# Patient Record
Sex: Male | Born: 1990 | Race: White | Hispanic: No | Marital: Married | Smoking: Never smoker
Health system: Southern US, Community
[De-identification: ages and names within clinical notes are randomized; demographics above are authoritative.]

---
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
## 2011-04-19 ENCOUNTER — Ambulatory Visit: Payer: Self-pay | Admitting: Unknown Physician Specialty

## 2011-05-14 ENCOUNTER — Ambulatory Visit: Payer: Self-pay | Admitting: Unknown Physician Specialty

## 2011-06-13 ENCOUNTER — Ambulatory Visit: Payer: Self-pay | Admitting: Unknown Physician Specialty

## 2012-03-10 ENCOUNTER — Ambulatory Visit: Payer: Self-pay | Admitting: Family Medicine

## 2012-05-26 ENCOUNTER — Emergency Department: Payer: Self-pay | Admitting: *Deleted

## 2012-06-27 ENCOUNTER — Emergency Department: Payer: Self-pay

## 2012-11-17 ENCOUNTER — Other Ambulatory Visit: Payer: Self-pay | Admitting: Psychiatry

## 2012-11-17 LAB — CBC WITH DIFFERENTIAL/PLATELET
Basophil %: 0.7 %
HGB: 16 g/dL (ref 13.0–18.0)
Lymphocyte #: 3 10*3/uL (ref 1.0–3.6)
MCH: 31.7 pg (ref 26.0–34.0)
MCHC: 33.9 g/dL (ref 32.0–36.0)
MCV: 94 fL (ref 80–100)
Neutrophil #: 2.5 10*3/uL (ref 1.4–6.5)
Neutrophil %: 41 %
RBC: 5.05 10*6/uL (ref 4.40–5.90)
RDW: 12.9 % (ref 11.5–14.5)
WBC: 6 10*3/uL (ref 3.8–10.6)

## 2012-11-17 LAB — COMPREHENSIVE METABOLIC PANEL
Alkaline Phosphatase: 81 U/L (ref 50–136)
Calcium, Total: 8.8 mg/dL (ref 8.5–10.1)
Chloride: 106 mmol/L (ref 98–107)
Co2: 31 mmol/L (ref 21–32)
Creatinine: 1.03 mg/dL (ref 0.60–1.30)
EGFR (Non-African Amer.): >60
Glucose: 120 mg/dL — ABNORMAL HIGH (ref 65–99)
Potassium: 4.1 mmol/L (ref 3.5–5.1)
SGOT(AST): 22 U/L (ref 15–37)
SGPT (ALT): 27 U/L (ref 12–78)
Total Protein: 7.8 g/dL (ref 6.4–8.2)

## 2012-11-17 LAB — VALPROIC ACID LEVEL: Valproic Acid: 32 ug/mL — ABNORMAL LOW

## 2012-11-20 ENCOUNTER — Ambulatory Visit: Payer: Self-pay | Admitting: Psychiatry

## 2012-12-11 ENCOUNTER — Ambulatory Visit: Payer: Self-pay | Admitting: Psychiatry

## 2012-12-25 LAB — DRUG SCREEN, URINE
Barbiturates, Ur Screen: NEGATIVE (ref ?–200)
Benzodiazepine, Ur Scrn: POSITIVE (ref ?–200)
Cocaine Metabolite,Ur ~~LOC~~: NEGATIVE (ref ?–300)
MDMA (Ecstasy)Ur Screen: NEGATIVE (ref ?–500)
Methadone, Ur Screen: NEGATIVE (ref ?–300)
Phencyclidine (PCP) Ur S: NEGATIVE (ref ?–25)

## 2013-01-10 ENCOUNTER — Ambulatory Visit: Payer: Self-pay | Admitting: Psychiatry

## 2013-04-24 LAB — DRUG SCREEN, URINE
Amphetamines, Ur Screen: NEGATIVE (ref ?–1000)
Barbiturates, Ur Screen: NEGATIVE (ref ?–200)
Benzodiazepine, Ur Scrn: NEGATIVE (ref ?–200)
Cannabinoid 50 Ng, Ur ~~LOC~~: POSITIVE (ref ?–50)
Methadone, Ur Screen: NEGATIVE (ref ?–300)
Opiate, Ur Screen: NEGATIVE (ref ?–300)
Phencyclidine (PCP) Ur S: NEGATIVE (ref ?–25)

## 2013-04-24 LAB — URINALYSIS, COMPLETE
Bilirubin,UR: NEGATIVE
Blood: NEGATIVE
Glucose,UR: NEGATIVE mg/dL (ref 0–75)
Ketone: NEGATIVE
Leukocyte Esterase: NEGATIVE
Protein: NEGATIVE
RBC,UR: <1 /HPF (ref 0–5)
Squamous Epithelial: NONE SEEN
WBC UR: NONE SEEN /HPF (ref 0–5)

## 2013-04-24 LAB — COMPREHENSIVE METABOLIC PANEL
Anion Gap: 4 — ABNORMAL LOW (ref 7–16)
Bilirubin,Total: 0.5 mg/dL (ref 0.2–1.0)
Calcium, Total: 9.6 mg/dL (ref 8.5–10.1)
Chloride: 104 mmol/L (ref 98–107)
EGFR (Non-African Amer.): >60
Glucose: 101 mg/dL — ABNORMAL HIGH (ref 65–99)
Osmolality: 281 (ref 275–301)
SGPT (ALT): 20 U/L (ref 12–78)
Sodium: 140 mmol/L (ref 136–145)
Total Protein: 8.1 g/dL (ref 6.4–8.2)

## 2013-04-24 LAB — TSH: Thyroid Stimulating Horm: 2.06 u[IU]/mL

## 2013-04-24 LAB — CBC
HGB: 15.4 g/dL (ref 13.0–18.0)
Platelet: 224 10*3/uL (ref 150–440)
RBC: 4.76 10*6/uL (ref 4.40–5.90)

## 2013-04-25 ENCOUNTER — Inpatient Hospital Stay: Payer: Self-pay | Admitting: Psychiatry

## 2014-11-22 ENCOUNTER — Emergency Department: Payer: Self-pay | Admitting: Emergency Medicine

## 2015-01-02 NOTE — H&P (Signed)
PATIENT NAME:  Shawn Phelps, Shawn Phelps MR#:  [id number] DATE OF BIRTH:  April 22, 1991  REFERRING PHYSICIAN: Emergency Room M.D.   ATTENDING PHYSICIAN: Kristine Linea, M.D.   IDENTIFYING DATA: Shawn Phelps is a 24 year old male with history of depression, anxiety, mood instability, and substance abuse.   CHIEF COMPLAINT: "I am here for help.".  HISTORY OF PRESENT ILLNESS: Shawn Phelps came to the hospital following a suicide attempt or gesture by cutting his wrist and throat superficially. He reports that on the day of admission, his wife called him at work to tell him that she is leaving him and is on her way to court to file for a divorce. At that time the patient was two hours away working. He return to home and did not find his wife or his 68-month-old baby at home and believed that she already left him. He scratched his wrists superficially several times and reportedly scratched his neck as well which the patient now denies.   Luckily his wife returned home and found him bleeding. The patient was brought initially to the office of Shawn Phelps who took the patient to the Emergency Room where he was committed by the Emergency Room physician. The patient denies symptoms of depression and feels that Cymbalta prescribed by Shawn Phelps has been working very well for him. He also receives clonazepam from Shawn Phelps. He reports good compliance with medication and adamantly denies any Klonopin misuse.   He endorses severe anxiety with frequent panic attacks and Klonopin has been helpful with that. He reports that he is allowed to take two 1 mg Klonopins a day which he usually takes in the afternoon and at night because it calms him down and makes interaction with his wife a little easier.   The patient is not forthcoming about his marital problems him and cannot explain why his wife threatened to divorce him. He denies psychotic symptoms. He denies symptoms suggestive of bipolar mania but endorse frequent mood swings  as if he had a mean alter ego who he is unable to control. He now believes that a diagnosis of bipolar disorder is a good possibility after and after discussing the case with Shawn Phelps, decided to try Seroquel. Indeed, he feels very hopeful and positive about the Seroquel as apparently he tried to take it once, obtaining it from a friend. He felt that it was calming him down. He denies ever buying Seroquel off the streets.   He denies drinking or illicit substance use but believes that marijuana was detected on urine tox screen. He states that he used it in a long time ago, but he knows that it stays in the system for 50 days.   PAST PSYCHIATRIC HISTORY: The patient had been a patient of Shawn Phelps for about two years. He believes that it was when he was still a teenager. They tried multiple antidepressants, SSRIs, and none of them worked. The patient was also tried on Depakote, but judging from his Depakote level, he has not been compliant.   He has a history of substance abuse which he minimizes or even denies and was in the outpatient intensive substance abuse treatment program at least twice at [facility] in the fall of 2012 and in the spring of 2014. This was always due to legal charges. The patient never felt that he had a substance problem. He had some legal charges that were substance-related and not a DUI but really does not want to discuss it with me.  Currently, he is on probation unsupervised and this was for marijuana possession.   FAMILY PSYCHIATRIC HISTORY: His sister as well as some other family members are taking antidepressants. There were no completed suicides in the family.   PAST MEDICAL HISTORY: None.   ALLERGIES: No known drug allergies.   MEDICATIONS ON ADMISSION: Cymbalta 60 mg, Klonopin 1 mg twice daily. The patient has not been compliant with Cymbalta as he run out.   SOCIAL HISTORY: He graduated from high school and had some college and community  college classes, does very well in school and made the dean's list on several occasions. He is married now and has a 64-month-old baby. He works in [employer]. His mother and father have been very supportive.   REVIEW OF SYSTEMS:  CONSTITUTIONAL: No fevers or chills. No weight changes.  EYES: No double or blurred vision.  ENT: No hearing loss.  RESPIRATORY: No shortness of breath or cough.  CARDIOVASCULAR: No chest pain or orthopnea.  GASTROINTESTINAL: No abdominal pain, nausea, vomiting, or diarrhea.  GENITOURINARY: No incontinence or frequency.  ENDOCRINE: No heat or cold intolerance.  LYMPHATIC: No anemia or easy bruising.  INTEGUMENTARY: No acne or rash.  MUSCULOSKELETAL: No muscle or joint pain.  NEUROLOGIC: No tingling or weakness.  PSYCHIATRIC: See history of present illness for details.   PHYSICAL EXAMINATION:  VITAL SIGNS: Blood pressure 113/68, pulse 72, respirations 20, temperature 98.  GENERAL: A well-developed young male in no acute distress.  HEENT: The pupils are equal, round, and reactive to light. Sclerae are anicteric.  NECK: Supple. No thyromegaly.  LUNGS: Clear to auscultation. No dullness to percussion.  HEART: Regular rhythm and rate. No murmurs, rubs, or gallops.  ABDOMEN: Soft, nontender, nondistended. Positive bowel sounds.  MUSCULOSKELETAL: Normal muscle strength in all extremities.  SKIN: No rashes or bruises.  LYMPHATIC: No cervical adenopathy.  NEUROLOGIC: Cranial nerves II through XII are intact.   LABORATORY DATA: Chemistries are within normal limits. Blood alcohol level is zero. LFTs within normal limits. TSH 2.06. Urine tox screen positive for cannabinoids. CBC within normal limits. Urinalysis is not suggestive of urinary tract infection.   MENTAL STATUS EXAMINATION ON ADMISSION: The patient is in bed, asleep, but easily arousable. He is pleasant, polite and cooperative. Slightly irritable on certain questions. He is well groomed and casually  dressed. He maintains good eye contact. His speech is of normal rhythm, rate and volume. Mood is anxious with normal affect. Thought processing is logical and goal oriented. Thought content: He denies suicidal or homicidal ideation but was admitted after a suicide attempt by cutting. There are no delusions or paranoia. There are no auditory or visual hallucinations. His cognition is grossly intact. His insight and judgment are questionable.   SUICIDE RISK ASSESSMENT ON ADMISSION: This is a patient with a long history of depression, anxiety, mood instability and substance abuse who came to the hospital after cutting his wrist in the context of  marital problems.  INITIAL DIAGNOSES:  AXIS I: Mood disorder, not otherwise specified. Rule out bipolar affective disorder. Marijuana abuse.  AXIS II: Deferred.  AXIS III: Cuts on left forearm.  AXIS IV: Mental illness, substance abuse, treatment compliance, family conflict, marital conflict.  AXIS V: Global assessment of functioning score on admission 25.   PLAN: The patient was admitted to [facility] Medicine unit for safety, stabilization and medication management. He was initially placed on suicide precautions and will be closely monitored for any unsafe behaviors. He underwent  full psychiatric and risk assessment. He received pharmacotherapy, individual and group psychotherapy, substance abuse counseling, and support from therapeutic milieu.   1.  Suicidal ideation. This has resolved. The patient is able to contract for safety.  2.  Mood. He wants to continue Cymbalta. We will continue 60 mg a day.  3.  Rule out bipolar affective disorder. The patient endorses frequent mood swings with hyperactivity, irritability, insomnia, short temper, poor impulse control. He discussed with Dr. Maryruth BunKapur treatment with Seroquel and wants to try it. We will start Seroquel at night, maybe Sequel XR 150 mg. We will also offer a low-dose Seroquel  throughout the day to address excessive anxiety.  4.  Benzodiazepine abuse. it is possible that the patient is abusing benzodiazepines. They are prescribed by Shawn Phelps. The patient was negative for benzodiazepines on admission, but it is not unusual with clonazepam. We will monitor for any symptoms of benzodiazepine withdrawals. We will offer detoxification if necessary.  5.  Anxiety. We will offer Vistaril. 6.  Substance abuse. The patient minimizes or frankly denies any problems with substances in spite of evidence to the contrary. Not only has he been in the intensive outpatient twice but was also referred to inpatient substance abuse treatment program. Reportedly he minimized his abuse at that time and was not accepted. The patient is not interested in treatment. He feels that he just completed an intensive outpatient and should be okay. This is good enough for court purpose.   DISPOSITION: He will be discharged to home with his wife unless he decides to go for substance abuse treatment. He will follow up with a psychiatrist. He believes that Shawn Phelps is the best doctor he has ever had.    ____________________________ Shawn GoodieJolanta B. Jennet MaduroPucilowska, Shawn Phelps jbp:np D: 04/25/2013 20:03:09 ET T: 04/25/2013 21:59:59 ET JOB#: [id number]  cc: Shawn B. Jennet MaduroPucilowska, Shawn Phelps, <Dictator> Shawn Phelps ELECTRONICALLY SIGNED 04/27/2013 15:12

## 2015-01-02 NOTE — Consult Note (Signed)
Consult: treatment recommendations Patient was seen as requested by attending MD to explore option of [facility] CD-IOP at this discharge and was seen as a possible prime candidate for treatment at this level of care after this inpatient treatment has been completed however he believes that because he has been in the CD-IOP in the past that he does not need treatment at the CD-IOP level. He was able to report that he was depressed and that he cut his wrist because of conflict between his wife and him. He also disclosed issue of conflict between him and his parents as it related to their perception about his substance abuse. Later his wife entered session and reviewed events that lead to this inpatient admission and an issue of breach of confidentiality among her co workers was also discussed.  Shawn Phelps denied suicidality even though he did admit to cutting his wrist and throat superficially. He reported that on the day of admission, his wife and he were experiencing distress in their marriage and thru a series of event which included cutting his wrist several times and also that he had scratched his neck. He also reported that he was brought initially to the office of Dr. Maryruth Phelps who took him to the Emergency Room where he was subsequently [facility].     Patient expressed desire for treatment for mental health issues and denied having a substance abuse problem. He disagreed with this therapist?s recommendation of [facility] after Discharge from the Behavioral Medicine Inpatient Unit. He admitted to abuse of Alcoholic Beverage as well as marijuana and his Rx med with a desire to not use/abuse substances but does not believe that he could benefit from CD-IOP. He was also focused on his return to home, place of employment and expressed desire to be discharged as early as possible. trem Outpatient counseling focused on co occurring disorders may be beneficial post this  inpatient treatment. Assigned Nurse informed of assessment recommendations which includes his plans to not follow CD-IOP at this inpatient discharge.   Electronic Signatures: Huel Phelps, Shawn (PsyD)  (Signed on 15-Aug-14 21:36)  Authored  Last Updated: 15-Aug-14 21:36 by Huel Phelps, Shawn (PsyD)

## 2016-01-12 ENCOUNTER — Encounter: Payer: Self-pay | Admitting: Emergency Medicine

## 2016-01-12 ENCOUNTER — Emergency Department
Admission: EM | Admit: 2016-01-12 | Discharge: 2016-01-12 | Disposition: A | Discharge status: Home / Self Care | Payer: 59 | Attending: Emergency Medicine | Admitting: Emergency Medicine

## 2016-01-12 DIAGNOSIS — W260XXA Contact with knife, initial encounter: Secondary | ICD-10-CM | POA: Diagnosis not present

## 2016-01-12 DIAGNOSIS — Y9389 Activity, other specified: Secondary | ICD-10-CM | POA: Insufficient documentation

## 2016-01-12 DIAGNOSIS — S61012A Laceration without foreign body of left thumb without damage to nail, initial encounter: Secondary | ICD-10-CM | POA: Diagnosis not present

## 2016-01-12 DIAGNOSIS — Y929 Unspecified place or not applicable: Secondary | ICD-10-CM | POA: Diagnosis not present

## 2016-01-12 DIAGNOSIS — Y999 Unspecified external cause status: Secondary | ICD-10-CM | POA: Insufficient documentation

## 2016-01-12 MED ORDER — LIDOCAINE HCL (PF) 1 % IJ SOLN
INTRAMUSCULAR | Status: AC
  Filled 2016-01-12: qty 10

## 2016-01-12 NOTE — ED Notes (Signed)
Pt with laceration to left thumb last night.

## 2016-01-12 NOTE — ED Provider Notes (Signed)
[facility] Emergency Department Provider Note  ____________________________________________  Time seen: Approximately 9:03 AM  I have reviewed the triage vital signs and the nursing notes.   HISTORY  Chief Complaint Laceration    HPI Shawn Phelps is a 25 y.o. male presents for evaluation of left thumb laceration. Patient reports slicing on knife approximately 10 hours prior to arrival. Last tetanus approximately one year ago.   History reviewed. No pertinent past medical history.  There are no active problems to display for this patient.   History reviewed. No pertinent past surgical history.  No current outpatient prescriptions on file.  Allergies Review of patient's allergies indicates no known allergies.  No family history on file.  Social History Social History  Substance Use Topics  . Smoking status: Never Smoker   . Smokeless tobacco: None  . Alcohol Use: No    Review of Systems Constitutional: No fever/chills Eyes: No visual changes. ENT: No sore throat. Cardiovascular: Denies chest pain. Respiratory: Denies shortness of breath. Gastrointestinal: No abdominal pain.  No nausea, no vomiting.  No diarrhea.  No constipation. Genitourinary: Negative for dysuria. Musculoskeletal: Negative for back pain. Skin: Positive for laceration at the base of the left thumb which is horizontal in nature approximately 3 cm. Neurological: Negative for headaches, focal weakness or numbness.  10-point ROS otherwise negative.  ____________________________________________   PHYSICAL EXAM:  VITAL SIGNS: ED Triage Vitals  Enc Vitals Group     BP 01/12/16 0834 125/77 mmHg     Pulse Rate 01/12/16 0834 71     Resp 01/12/16 0834 20     Temp 01/12/16 0834 97.7 F (36.5 C)     Temp Source 01/12/16 0834 Oral     SpO2 01/12/16 0834 100 %     Weight 01/12/16 0834 165 lb (74.844 kg)     Height 01/12/16 0834 6' (1.829 m)     Head Cir --      Peak  Flow --      Pain Score 01/12/16 0827 7     Pain Loc --      Pain Edu? --      Excl. in GC? --     Constitutional: Alert and oriented. Well appearing and in no acute distress. Musculoskeletal: No lower extremity tenderness nor edema.  No joint effusions. Neurologic:  Normal speech and language. No gross focal neurologic deficits are appreciated. No gait instability. Skin:  Palmar aspect left thumb laceration at the base of the thumb approximately 3 cm horizontal. No tendon or ligament involvement of bone noted. Distally neurovascularly intact. Range of motion. Good strength. Psychiatric: Mood and affect are normal. Speech and behavior are normal.  ____________________________________________   LABS (all labs ordered are listed, but only abnormal results are displayed)  Labs Reviewed - No data to display ____________________________________________    PROCEDURES  Procedure(s) performed: Yes  LACERATION REPAIR Performed by: Evangeline Dakin Authorized by: Evangeline Dakin Consent: Verbal consent obtained. Risks and benefits: risks, benefits and alternatives were discussed Consent given by: patient Patient identity confirmed: provided demographic data Prepped and Draped in normal sterile fashion Wound explored  Laceration Location: Base of left thumb  Laceration Length: 2cm  No Foreign Bodies seen or palpated  Anesthesia: local infiltration  Local anesthetic: lidocaine 1% without epinephrine  Anesthetic total: 3 ml  Irrigation method: syringe Amount of cleaning: standard  Skin closure: 4-0 vicryl,   Number of sutures: 5  Technique: simple interrupted  Patient tolerance: Patient tolerated the  procedure well with no immediate complications. Critical Care performed: No  ____________________________________________   INITIAL IMPRESSION / ASSESSMENT AND PLAN / ED COURSE  Pertinent labs & imaging results that were available during my care of the patient were  reviewed by me and considered in my medical decision making (see chart for details).  Laceration to left thumb area noted as detailed above. Patient had sutures removed in 7-10 days. Encouraged her to urgent care for removal. He denies any other complaints at this time. ____________________________________________   FINAL CLINICAL IMPRESSION(S) / ED DIAGNOSES  Final diagnoses:  Thumb laceration, left, initial encounter     This chart was dictated using voice recognition software/Dragon. Despite best efforts to proofread, errors can occur which can change the meaning. Any change was purely unintentional.   Evangeline Dakinharles M Mahalie Kanner, PA-C 01/12/16 [id number]  Jeanmarie PlantJames A McShane, MD 01/12/16 [phone]

## 2016-01-12 NOTE — Discharge Instructions (Signed)
Laceration Care, Adult  A laceration is a cut that goes through all layers of the skin. The cut also goes into the tissue that is right under the skin. Some cuts heal on their own. Others need to be closed with stitches (sutures), staples, skin adhesive strips, or wound glue. Taking care of your cut lowers your risk of infection and helps your cut to heal better.  HOW TO TAKE CARE OF YOUR CUT  For stitches or staples:  · Keep the wound clean and dry.  · If you were given a bandage (dressing), you should change it at least one time per day or as told by your doctor. You should also change it if it gets wet or dirty.  · Keep the wound completely dry for the first 24 hours or as told by your doctor. After that time, you may take a shower or a bath. However, make sure that the wound is not soaked in water until after the stitches or staples have been removed.  · Clean the wound one time each day or as told by your doctor:    Wash the wound with soap and water.    Rinse the wound with water until all of the soap comes off.    Pat the wound dry with a clean towel. Do not rub the wound.  · After you clean the wound, put a thin layer of antibiotic ointment on it as told by your doctor. This ointment:    Helps to prevent infection.    Keeps the bandage from sticking to the wound.  · Have your stitches or staples removed as told by your doctor.  If your doctor used skin adhesive strips:   · Keep the wound clean and dry.  · If you were given a bandage, you should change it at least one time per day or as told by your doctor. You should also change it if it gets dirty or wet.  · Do not get the skin adhesive strips wet. You can take a shower or a bath, but be careful to keep the wound dry.  · If the wound gets wet, pat it dry with a clean towel. Do not rub the wound.  · Skin adhesive strips fall off on their own. You can trim the strips as the wound heals. Do not remove any strips that are still stuck to the wound. They will  fall off after a while.  If your doctor used wound glue:  · Try to keep your wound dry, but you may briefly wet it in the shower or bath. Do not soak the wound in water, such as by swimming.  · After you take a shower or a bath, gently pat the wound dry with a clean towel. Do not rub the wound.  · Do not do any activities that will make you really sweaty until the skin glue has fallen off on its own.  · Do not apply liquid, cream, or ointment medicine to your wound while the skin glue is still on.  · If you were given a bandage, you should change it at least one time per day or as told by your doctor. You should also change it if it gets dirty or wet.  · If a bandage is placed over the wound, do not let the tape for the bandage touch the skin glue.  · Do not pick at the glue. The skin glue usually stays on for 5-10 days. Then, it   falls off of the skin.  General Instructions   · To help prevent scarring, make sure to cover your wound with sunscreen whenever you are outside after stitches are removed, after adhesive strips are removed, or when wound glue stays in place and the wound is healed. Make sure to wear a sunscreen of at least 30 SPF.  · Take over-the-counter and prescription medicines only as told by your doctor.  · If you were given antibiotic medicine or ointment, take or apply it as told by your doctor. Do not stop using the antibiotic even if your wound is getting better.  · Do not scratch or pick at the wound.  · Keep all follow-up visits as told by your doctor. This is important.  · Check your wound every day for signs of infection. Watch for:    Redness, swelling, or pain.    Fluid, blood, or pus.  · Raise (elevate) the injured area above the level of your heart while you are sitting or lying down, if possible.  GET HELP IF:  · You got a tetanus shot and you have any of these problems at the injection site:    Swelling.    Very bad pain.    Redness.    Bleeding.  · You have a fever.  · A wound that was  closed breaks open.  · You notice a bad smell coming from your wound or your bandage.  · You notice something coming out of the wound, such as wood or glass.  · Medicine does not help your pain.  · You have more redness, swelling, or pain at the site of your wound.  · You have fluid, blood, or pus coming from your wound.  · You notice a change in the color of your skin near your wound.  · You need to change the bandage often because fluid, blood, or pus is coming from the wound.  · You start to have a new rash.  · You start to have numbness around the wound.  GET HELP RIGHT AWAY IF:  · You have very bad swelling around the wound.  · Your pain suddenly gets worse and is very bad.  · You notice painful lumps near the wound or on skin that is anywhere on your body.  · You have a red streak going away from your wound.  · The wound is on your hand or foot and you cannot move a finger or toe like you usually can.  · The wound is on your hand or foot and you notice that your fingers or toes look pale or bluish.     This information is not intended to replace advice given to you by your health care provider. Make sure you discuss any questions you have with your health care provider.     Document Released: 02/15/2008 Document Revised: 01/13/2015 Document Reviewed: 08/25/2014  Elsevier Interactive Patient Education ©2016 [employer]

## 2016-01-12 NOTE — ED Notes (Signed)
States he was using a "cheap" knife to open a package and it slipped   Laceration to left thumb area

## 2016-03-22 DIAGNOSIS — G894 Chronic pain syndrome: Secondary | ICD-10-CM | POA: Diagnosis not present

## 2017-06-07 ENCOUNTER — Emergency Department: Payer: 59

## 2017-06-07 ENCOUNTER — Emergency Department
Admission: EM | Admit: 2017-06-07 | Discharge: 2017-06-07 | Disposition: A | Discharge status: Home / Self Care | Payer: 59 | Attending: Emergency Medicine | Admitting: Emergency Medicine

## 2017-06-07 DIAGNOSIS — R451 Restlessness and agitation: Secondary | ICD-10-CM | POA: Diagnosis not present

## 2017-06-07 DIAGNOSIS — Y929 Unspecified place or not applicable: Secondary | ICD-10-CM | POA: Insufficient documentation

## 2017-06-07 DIAGNOSIS — F1994 Other psychoactive substance use, unspecified with psychoactive substance-induced mood disorder: Secondary | ICD-10-CM

## 2017-06-07 DIAGNOSIS — Y999 Unspecified external cause status: Secondary | ICD-10-CM | POA: Diagnosis not present

## 2017-06-07 DIAGNOSIS — Y9389 Activity, other specified: Secondary | ICD-10-CM | POA: Insufficient documentation

## 2017-06-07 DIAGNOSIS — W1830XA Fall on same level, unspecified, initial encounter: Secondary | ICD-10-CM | POA: Diagnosis not present

## 2017-06-07 DIAGNOSIS — R456 Violent behavior: Secondary | ICD-10-CM | POA: Diagnosis not present

## 2017-06-07 DIAGNOSIS — S0081XA Abrasion of other part of head, initial encounter: Secondary | ICD-10-CM | POA: Diagnosis not present

## 2017-06-07 DIAGNOSIS — S0101XA Laceration without foreign body of scalp, initial encounter: Secondary | ICD-10-CM

## 2017-06-07 DIAGNOSIS — R51 Headache: Secondary | ICD-10-CM | POA: Diagnosis not present

## 2017-06-07 DIAGNOSIS — S1091XA Abrasion of unspecified part of neck, initial encounter: Secondary | ICD-10-CM | POA: Diagnosis not present

## 2017-06-07 LAB — COMPREHENSIVE METABOLIC PANEL
ALBUMIN: 4.7 g/dL (ref 3.5–5.0)
ALK PHOS: 77 U/L (ref 38–126)
ALT: 16 U/L — AB (ref 17–63)
AST: 30 U/L (ref 15–41)
Anion gap: 12 (ref 5–15)
BILIRUBIN TOTAL: 1.6 mg/dL — AB (ref 0.3–1.2)
BUN: 20 mg/dL (ref 6–20)
CO2: 22 mmol/L (ref 22–32)
CREATININE: 1.19 mg/dL (ref 0.61–1.24)
Calcium: 9.6 mg/dL (ref 8.9–10.3)
Chloride: 108 mmol/L (ref 101–111)
GFR calc Af Amer: >60 mL/min (ref >60)
GLUCOSE: 103 mg/dL — AB (ref 65–99)
POTASSIUM: 3.2 mmol/L — AB (ref 3.5–5.1)
Sodium: 142 mmol/L (ref 135–145)
TOTAL PROTEIN: 8.1 g/dL (ref 6.5–8.1)

## 2017-06-07 LAB — CBC
HEMATOCRIT: 43.6 % (ref 40.0–52.0)
Hemoglobin: 15.1 g/dL (ref 13.0–18.0)
MCH: 31.5 pg (ref 26.0–34.0)
MCHC: 34.6 g/dL (ref 32.0–36.0)
MCV: 90.8 fL (ref 80.0–100.0)
PLATELETS: 281 10*3/uL (ref 150–440)
RBC: 4.81 MIL/uL (ref 4.40–5.90)
RDW: 13.3 % (ref 11.5–14.5)
WBC: 22 10*3/uL — AB (ref 3.8–10.6)

## 2017-06-07 LAB — SALICYLATE LEVEL: Salicylate Lvl: <7 mg/dL (ref 2.8–30.0)

## 2017-06-07 LAB — ACETAMINOPHEN LEVEL: Acetaminophen (Tylenol), Serum: <10 ug/mL — ABNORMAL LOW (ref 10–30)

## 2017-06-07 LAB — CK: Total CK: 492 U/L — ABNORMAL HIGH (ref 49–397)

## 2017-06-07 LAB — GLUCOSE, CAPILLARY: Glucose-Capillary: 139 mg/dL — ABNORMAL HIGH (ref 65–99)

## 2017-06-07 LAB — ETHANOL

## 2017-06-07 MED ORDER — DIPHENHYDRAMINE HCL 50 MG/ML IJ SOLN
50.0000 mg | Freq: Once | INTRAMUSCULAR | Status: AC
  Administered 2017-06-07: 50 mg via INTRAVENOUS

## 2017-06-07 MED ORDER — HALOPERIDOL LACTATE 5 MG/ML IJ SOLN
5.0000 mg | Freq: Once | INTRAMUSCULAR | Status: AC
  Administered 2017-06-07: 5 mg via INTRAMUSCULAR

## 2017-06-07 MED ORDER — LORAZEPAM 2 MG/ML IJ SOLN
2.0000 mg | Freq: Once | INTRAMUSCULAR | Status: AC
  Administered 2017-06-07: 2 mg via INTRAMUSCULAR

## 2017-06-07 MED ORDER — DIPHENHYDRAMINE HCL 50 MG/ML IJ SOLN
INTRAMUSCULAR | Status: AC
  Filled 2017-06-07: qty 1

## 2017-06-07 MED ORDER — SODIUM CHLORIDE 0.9 % IV BOLUS (SEPSIS)
2000.0000 mL | Freq: Once | INTRAVENOUS | Status: AC
  Administered 2017-06-07: 2000 mL via INTRAVENOUS

## 2017-06-07 MED ORDER — LORAZEPAM 2 MG/ML IJ SOLN
INTRAMUSCULAR | Status: AC
  Filled 2017-06-07: qty 1

## 2017-06-07 MED ORDER — HALOPERIDOL LACTATE 5 MG/ML IJ SOLN
INTRAMUSCULAR | Status: AC
  Filled 2017-06-07: qty 1

## 2017-06-07 NOTE — Discharge Instructions (Signed)
THE PATIENT IS NOW MEDICALLY STABLE FOR BOOKING  Today you had 3 staples placed in the back of your head. These need to come out in 10-14 days. Any doctor can do this. Please return to the emergency department for any concerns.  It was a pleasure to take care of you today, and thank you for coming to our emergency department.  If you have any questions or concerns before leaving please ask the nurse to grab me and I'm more than happy to go through your aftercare instructions again.  If you were prescribed any opioid pain medication today such as Norco, Vicodin, Percocet, morphine, hydrocodone, or oxycodone please make sure you do not drive when you are taking this medication as it can alter your ability to drive safely.  If you have any concerns once you are home that you are not improving or are in fact getting worse before you can make it to your follow-up appointment, please do not hesitate to call 911 and come back for further evaluation.  Merrily Brittle, MD  Results for orders placed or performed during the hospital encounter of 06/07/17  Glucose, capillary  Result Value Ref Range   Glucose-Capillary 139 (H) 65 - 99 mg/dL  Comprehensive metabolic panel  Result Value Ref Range   Sodium 142 135 - 145 mmol/L   Potassium 3.2 (L) 3.5 - 5.1 mmol/L   Chloride 108 101 - 111 mmol/L   CO2 22 22 - 32 mmol/L   Glucose, Bld 103 (H) 65 - 99 mg/dL   BUN 20 6 - 20 mg/dL   Creatinine, Ser 9.14 0.61 - 1.24 mg/dL   Calcium 9.6 8.9 - 78.2 mg/dL   Total Protein 8.1 6.5 - 8.1 g/dL   Albumin 4.7 3.5 - 5.0 g/dL   AST 30 15 - 41 U/L   ALT 16 (L) 17 - 63 U/L   Alkaline Phosphatase 77 38 - 126 U/L   Total Bilirubin 1.6 (H) 0.3 - 1.2 mg/dL   GFR calc non Af Amer >60 >60 mL/min   GFR calc Af Amer >60 >60 mL/min   Anion gap 12 5 - 15  Ethanol  Result Value Ref Range   Alcohol, Ethyl (B) <10 <10 mg/dL  Salicylate level  Result Value Ref Range   Salicylate Lvl <7.0 2.8 - 30.0 mg/dL  Acetaminophen level   Result Value Ref Range   Acetaminophen (Tylenol), Serum <10 (L) 10 - 30 ug/mL  cbc  Result Value Ref Range   WBC 22.0 (H) 3.8 - 10.6 K/uL   RBC 4.81 4.40 - 5.90 MIL/uL   Hemoglobin 15.1 13.0 - 18.0 g/dL   HCT 95.6 21.3 - 08.6 %   MCV 90.8 80.0 - 100.0 fL   MCH 31.5 26.0 - 34.0 pg   MCHC 34.6 32.0 - 36.0 g/dL   RDW 57.8 46.9 - 62.9 %   Platelets 281 150 - 440 K/uL  CK  Result Value Ref Range   Total CK 492 (H) 49 - 397 U/L   Ct Head Wo Contrast  Result Date: 06/07/2017 CLINICAL DATA:  Posttraumatic headache EXAM: CT HEAD WITHOUT CONTRAST TECHNIQUE: Contiguous axial images were obtained from the base of the skull through the vertex without intravenous contrast. COMPARISON:  None. FINDINGS: Brain: No evidence of acute infarction, hemorrhage, hydrocephalus, extra-axial collection or mass lesion/mass effect. Image quality degraded by motion. Vascular: Negative for hyperdense vessel Skull: Negative Sinuses/Orbits: Negative Other: None IMPRESSION: Allowing for motion, normal CT head Electronically Signed   By:  Marlan Palau M.D.   On: 06/07/2017 15:20
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
## 2017-06-07 NOTE — ED Notes (Signed)
Pt was assisted with a urinal however pt was not able to urinate.
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
## 2017-06-07 NOTE — ED Notes (Signed)
Discharge instructions reviewed with him and he verbalized agreement and understanding  
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
## 2017-06-07 NOTE — ED Notes (Signed)
BEHAVIORAL HEALTH ROUNDING Patient sleeping: Yes.   Patient alert and oriented: eyes closed  Appears to be asleep Behavior appropriate: Yes.  ; If no, describe:  Nutrition and fluids offered: Yes  Toileting and hygiene offered: sleeping Sitter present: q 15 minute observations and security monitoring Law enforcement present: yes  ODS 

## 2017-06-07 NOTE — ED Notes (Signed)

## 2017-06-07 NOTE — ED Notes (Signed)
BEHAVIORAL HEALTH ROUNDING Patient sleeping: No. Patient alert and oriented: yes Behavior appropriate: Yes.  ; If no, describe:  Nutrition and fluids offered: yes Toileting and hygiene offered: Yes  Sitter present: q15 minute observations and security  monitoring Law enforcement present: Yes  ODS  

## 2017-06-20 DIAGNOSIS — F1220 Cannabis dependence, uncomplicated: Secondary | ICD-10-CM | POA: Diagnosis not present

## 2017-06-21 DIAGNOSIS — F1510 Other stimulant abuse, uncomplicated: Secondary | ICD-10-CM | POA: Diagnosis not present

## 2017-06-21 DIAGNOSIS — F319 Bipolar disorder, unspecified: Secondary | ICD-10-CM | POA: Diagnosis not present

## 2017-06-21 DIAGNOSIS — F1420 Cocaine dependence, uncomplicated: Secondary | ICD-10-CM | POA: Diagnosis not present

## 2017-06-21 DIAGNOSIS — F1320 Sedative, hypnotic or anxiolytic dependence, uncomplicated: Secondary | ICD-10-CM | POA: Diagnosis not present

## 2017-06-21 DIAGNOSIS — F419 Anxiety disorder, unspecified: Secondary | ICD-10-CM | POA: Diagnosis not present

## 2017-06-21 DIAGNOSIS — F1110 Opioid abuse, uncomplicated: Secondary | ICD-10-CM | POA: Diagnosis not present

## 2017-06-21 DIAGNOSIS — F1220 Cannabis dependence, uncomplicated: Secondary | ICD-10-CM | POA: Diagnosis not present

## 2017-06-22 DIAGNOSIS — F1220 Cannabis dependence, uncomplicated: Secondary | ICD-10-CM | POA: Diagnosis not present

## 2017-06-23 DIAGNOSIS — F1220 Cannabis dependence, uncomplicated: Secondary | ICD-10-CM | POA: Diagnosis not present

## 2017-06-24 DIAGNOSIS — F1220 Cannabis dependence, uncomplicated: Secondary | ICD-10-CM | POA: Diagnosis not present

## 2017-06-25 DIAGNOSIS — F1220 Cannabis dependence, uncomplicated: Secondary | ICD-10-CM | POA: Diagnosis not present

## 2017-06-26 DIAGNOSIS — F1420 Cocaine dependence, uncomplicated: Secondary | ICD-10-CM | POA: Diagnosis not present

## 2017-06-26 DIAGNOSIS — F419 Anxiety disorder, unspecified: Secondary | ICD-10-CM | POA: Diagnosis not present

## 2017-06-26 DIAGNOSIS — F1220 Cannabis dependence, uncomplicated: Secondary | ICD-10-CM | POA: Diagnosis not present

## 2017-06-26 DIAGNOSIS — F1510 Other stimulant abuse, uncomplicated: Secondary | ICD-10-CM | POA: Diagnosis not present

## 2017-06-26 DIAGNOSIS — F1320 Sedative, hypnotic or anxiolytic dependence, uncomplicated: Secondary | ICD-10-CM | POA: Diagnosis not present

## 2017-06-26 DIAGNOSIS — F319 Bipolar disorder, unspecified: Secondary | ICD-10-CM | POA: Diagnosis not present

## 2017-06-26 DIAGNOSIS — F1110 Opioid abuse, uncomplicated: Secondary | ICD-10-CM | POA: Diagnosis not present

## 2017-06-27 DIAGNOSIS — F1220 Cannabis dependence, uncomplicated: Secondary | ICD-10-CM | POA: Diagnosis not present

## 2017-06-28 DIAGNOSIS — F1220 Cannabis dependence, uncomplicated: Secondary | ICD-10-CM | POA: Diagnosis not present

## 2017-06-29 DIAGNOSIS — F1220 Cannabis dependence, uncomplicated: Secondary | ICD-10-CM | POA: Diagnosis not present

## 2017-06-30 DIAGNOSIS — F1220 Cannabis dependence, uncomplicated: Secondary | ICD-10-CM | POA: Diagnosis not present

## 2017-07-01 DIAGNOSIS — F1220 Cannabis dependence, uncomplicated: Secondary | ICD-10-CM | POA: Diagnosis not present

## 2017-07-02 DIAGNOSIS — F1220 Cannabis dependence, uncomplicated: Secondary | ICD-10-CM | POA: Diagnosis not present

## 2017-07-03 DIAGNOSIS — F1220 Cannabis dependence, uncomplicated: Secondary | ICD-10-CM | POA: Diagnosis not present

## 2017-07-04 DIAGNOSIS — F319 Bipolar disorder, unspecified: Secondary | ICD-10-CM | POA: Diagnosis not present

## 2017-07-04 DIAGNOSIS — F1510 Other stimulant abuse, uncomplicated: Secondary | ICD-10-CM | POA: Diagnosis not present

## 2017-07-04 DIAGNOSIS — F1110 Opioid abuse, uncomplicated: Secondary | ICD-10-CM | POA: Diagnosis not present

## 2017-07-04 DIAGNOSIS — F1320 Sedative, hypnotic or anxiolytic dependence, uncomplicated: Secondary | ICD-10-CM | POA: Diagnosis not present

## 2017-07-04 DIAGNOSIS — F1420 Cocaine dependence, uncomplicated: Secondary | ICD-10-CM | POA: Diagnosis not present

## 2017-07-04 DIAGNOSIS — F1220 Cannabis dependence, uncomplicated: Secondary | ICD-10-CM | POA: Diagnosis not present

## 2017-07-04 DIAGNOSIS — F419 Anxiety disorder, unspecified: Secondary | ICD-10-CM | POA: Diagnosis not present

## 2017-07-05 DIAGNOSIS — F1220 Cannabis dependence, uncomplicated: Secondary | ICD-10-CM | POA: Diagnosis not present

## 2017-07-06 DIAGNOSIS — F1220 Cannabis dependence, uncomplicated: Secondary | ICD-10-CM | POA: Diagnosis not present

## 2017-07-07 DIAGNOSIS — F1220 Cannabis dependence, uncomplicated: Secondary | ICD-10-CM | POA: Diagnosis not present

## 2017-07-08 DIAGNOSIS — F1220 Cannabis dependence, uncomplicated: Secondary | ICD-10-CM | POA: Diagnosis not present

## 2017-07-09 DIAGNOSIS — F1220 Cannabis dependence, uncomplicated: Secondary | ICD-10-CM | POA: Diagnosis not present

## 2017-07-10 DIAGNOSIS — F1220 Cannabis dependence, uncomplicated: Secondary | ICD-10-CM | POA: Diagnosis not present

## 2017-07-11 DIAGNOSIS — F1220 Cannabis dependence, uncomplicated: Secondary | ICD-10-CM | POA: Diagnosis not present

## 2017-07-12 DIAGNOSIS — F1510 Other stimulant abuse, uncomplicated: Secondary | ICD-10-CM | POA: Diagnosis not present

## 2017-07-12 DIAGNOSIS — F419 Anxiety disorder, unspecified: Secondary | ICD-10-CM | POA: Diagnosis not present

## 2017-07-12 DIAGNOSIS — F1320 Sedative, hypnotic or anxiolytic dependence, uncomplicated: Secondary | ICD-10-CM | POA: Diagnosis not present

## 2017-07-12 DIAGNOSIS — F1220 Cannabis dependence, uncomplicated: Secondary | ICD-10-CM | POA: Diagnosis not present

## 2017-07-12 DIAGNOSIS — F319 Bipolar disorder, unspecified: Secondary | ICD-10-CM | POA: Diagnosis not present

## 2017-07-12 DIAGNOSIS — F1420 Cocaine dependence, uncomplicated: Secondary | ICD-10-CM | POA: Diagnosis not present

## 2017-07-12 DIAGNOSIS — F1110 Opioid abuse, uncomplicated: Secondary | ICD-10-CM | POA: Diagnosis not present

## 2017-07-13 DIAGNOSIS — F1220 Cannabis dependence, uncomplicated: Secondary | ICD-10-CM | POA: Diagnosis not present

## 2017-07-14 DIAGNOSIS — F1220 Cannabis dependence, uncomplicated: Secondary | ICD-10-CM | POA: Diagnosis not present

## 2017-07-15 DIAGNOSIS — F1220 Cannabis dependence, uncomplicated: Secondary | ICD-10-CM | POA: Diagnosis not present

## 2017-07-16 DIAGNOSIS — F1220 Cannabis dependence, uncomplicated: Secondary | ICD-10-CM | POA: Diagnosis not present

## 2017-07-17 DIAGNOSIS — F1220 Cannabis dependence, uncomplicated: Secondary | ICD-10-CM | POA: Diagnosis not present

## 2017-07-18 DIAGNOSIS — F1220 Cannabis dependence, uncomplicated: Secondary | ICD-10-CM | POA: Diagnosis not present

## 2017-07-19 DIAGNOSIS — F1320 Sedative, hypnotic or anxiolytic dependence, uncomplicated: Secondary | ICD-10-CM | POA: Diagnosis not present

## 2017-07-19 DIAGNOSIS — F1220 Cannabis dependence, uncomplicated: Secondary | ICD-10-CM | POA: Diagnosis not present

## 2017-07-19 DIAGNOSIS — F419 Anxiety disorder, unspecified: Secondary | ICD-10-CM | POA: Diagnosis not present

## 2017-07-19 DIAGNOSIS — F1420 Cocaine dependence, uncomplicated: Secondary | ICD-10-CM | POA: Diagnosis not present

## 2017-07-19 DIAGNOSIS — F1510 Other stimulant abuse, uncomplicated: Secondary | ICD-10-CM | POA: Diagnosis not present

## 2017-07-19 DIAGNOSIS — F1110 Opioid abuse, uncomplicated: Secondary | ICD-10-CM | POA: Diagnosis not present

## 2017-07-19 DIAGNOSIS — F319 Bipolar disorder, unspecified: Secondary | ICD-10-CM | POA: Diagnosis not present

## 2017-07-20 DIAGNOSIS — F1121 Opioid dependence, in remission: Secondary | ICD-10-CM | POA: Diagnosis not present

## 2017-07-20 DIAGNOSIS — F1221 Cannabis dependence, in remission: Secondary | ICD-10-CM | POA: Diagnosis not present

## 2017-07-20 DIAGNOSIS — F3110 Bipolar disorder, current episode manic without psychotic features, unspecified: Secondary | ICD-10-CM | POA: Diagnosis not present

## 2017-07-20 DIAGNOSIS — F1520 Other stimulant dependence, uncomplicated: Secondary | ICD-10-CM | POA: Diagnosis not present

## 2017-07-21 DIAGNOSIS — F1521 Other stimulant dependence, in remission: Secondary | ICD-10-CM | POA: Diagnosis not present

## 2017-07-21 DIAGNOSIS — F1121 Opioid dependence, in remission: Secondary | ICD-10-CM | POA: Diagnosis not present

## 2017-07-21 DIAGNOSIS — F3110 Bipolar disorder, current episode manic without psychotic features, unspecified: Secondary | ICD-10-CM | POA: Diagnosis not present

## 2017-07-21 DIAGNOSIS — F1221 Cannabis dependence, in remission: Secondary | ICD-10-CM | POA: Diagnosis not present

## 2017-07-21 DIAGNOSIS — F319 Bipolar disorder, unspecified: Secondary | ICD-10-CM | POA: Diagnosis not present

## 2017-07-21 DIAGNOSIS — F1520 Other stimulant dependence, uncomplicated: Secondary | ICD-10-CM | POA: Diagnosis not present

## 2017-07-21 DIAGNOSIS — F1920 Other psychoactive substance dependence, uncomplicated: Secondary | ICD-10-CM | POA: Diagnosis not present

## 2017-07-24 DIAGNOSIS — F1121 Opioid dependence, in remission: Secondary | ICD-10-CM | POA: Diagnosis not present

## 2017-07-24 DIAGNOSIS — F1521 Other stimulant dependence, in remission: Secondary | ICD-10-CM | POA: Diagnosis not present

## 2017-07-24 DIAGNOSIS — F1221 Cannabis dependence, in remission: Secondary | ICD-10-CM | POA: Diagnosis not present

## 2017-07-24 DIAGNOSIS — F3110 Bipolar disorder, current episode manic without psychotic features, unspecified: Secondary | ICD-10-CM | POA: Diagnosis not present

## 2017-07-24 DIAGNOSIS — F1520 Other stimulant dependence, uncomplicated: Secondary | ICD-10-CM | POA: Diagnosis not present

## 2017-07-24 DIAGNOSIS — F319 Bipolar disorder, unspecified: Secondary | ICD-10-CM | POA: Diagnosis not present

## 2017-07-26 DIAGNOSIS — F3110 Bipolar disorder, current episode manic without psychotic features, unspecified: Secondary | ICD-10-CM | POA: Diagnosis not present

## 2017-07-26 DIAGNOSIS — F1221 Cannabis dependence, in remission: Secondary | ICD-10-CM | POA: Diagnosis not present

## 2017-07-26 DIAGNOSIS — F1520 Other stimulant dependence, uncomplicated: Secondary | ICD-10-CM | POA: Diagnosis not present

## 2017-07-26 DIAGNOSIS — F1121 Opioid dependence, in remission: Secondary | ICD-10-CM | POA: Diagnosis not present

## 2017-07-26 DIAGNOSIS — F319 Bipolar disorder, unspecified: Secondary | ICD-10-CM | POA: Diagnosis not present

## 2017-07-26 DIAGNOSIS — F1521 Other stimulant dependence, in remission: Secondary | ICD-10-CM | POA: Diagnosis not present

## 2017-07-27 DIAGNOSIS — F1521 Other stimulant dependence, in remission: Secondary | ICD-10-CM | POA: Diagnosis not present

## 2017-07-27 DIAGNOSIS — F1221 Cannabis dependence, in remission: Secondary | ICD-10-CM | POA: Diagnosis not present

## 2017-07-27 DIAGNOSIS — F1121 Opioid dependence, in remission: Secondary | ICD-10-CM | POA: Diagnosis not present

## 2017-07-27 DIAGNOSIS — F1520 Other stimulant dependence, uncomplicated: Secondary | ICD-10-CM | POA: Diagnosis not present

## 2017-07-27 DIAGNOSIS — F319 Bipolar disorder, unspecified: Secondary | ICD-10-CM | POA: Diagnosis not present

## 2017-07-27 DIAGNOSIS — F3110 Bipolar disorder, current episode manic without psychotic features, unspecified: Secondary | ICD-10-CM | POA: Diagnosis not present

## 2017-07-28 DIAGNOSIS — F1920 Other psychoactive substance dependence, uncomplicated: Secondary | ICD-10-CM | POA: Diagnosis not present

## 2017-07-31 DIAGNOSIS — F1520 Other stimulant dependence, uncomplicated: Secondary | ICD-10-CM | POA: Diagnosis not present

## 2017-07-31 DIAGNOSIS — F1521 Other stimulant dependence, in remission: Secondary | ICD-10-CM | POA: Diagnosis not present

## 2017-07-31 DIAGNOSIS — F1221 Cannabis dependence, in remission: Secondary | ICD-10-CM | POA: Diagnosis not present

## 2017-07-31 DIAGNOSIS — F319 Bipolar disorder, unspecified: Secondary | ICD-10-CM | POA: Diagnosis not present

## 2017-07-31 DIAGNOSIS — F3110 Bipolar disorder, current episode manic without psychotic features, unspecified: Secondary | ICD-10-CM | POA: Diagnosis not present

## 2017-07-31 DIAGNOSIS — F1121 Opioid dependence, in remission: Secondary | ICD-10-CM | POA: Diagnosis not present

## 2017-08-01 DIAGNOSIS — F1920 Other psychoactive substance dependence, uncomplicated: Secondary | ICD-10-CM | POA: Diagnosis not present

## 2017-08-02 DIAGNOSIS — F1521 Other stimulant dependence, in remission: Secondary | ICD-10-CM | POA: Diagnosis not present

## 2017-08-02 DIAGNOSIS — F1221 Cannabis dependence, in remission: Secondary | ICD-10-CM | POA: Diagnosis not present

## 2017-08-02 DIAGNOSIS — F3110 Bipolar disorder, current episode manic without psychotic features, unspecified: Secondary | ICD-10-CM | POA: Diagnosis not present

## 2017-08-02 DIAGNOSIS — F319 Bipolar disorder, unspecified: Secondary | ICD-10-CM | POA: Diagnosis not present

## 2017-08-02 DIAGNOSIS — F1121 Opioid dependence, in remission: Secondary | ICD-10-CM | POA: Diagnosis not present

## 2017-08-02 DIAGNOSIS — F1520 Other stimulant dependence, uncomplicated: Secondary | ICD-10-CM | POA: Diagnosis not present

## 2017-08-04 DIAGNOSIS — F1520 Other stimulant dependence, uncomplicated: Secondary | ICD-10-CM | POA: Diagnosis not present

## 2017-08-04 DIAGNOSIS — F1121 Opioid dependence, in remission: Secondary | ICD-10-CM | POA: Diagnosis not present

## 2017-08-04 DIAGNOSIS — F1221 Cannabis dependence, in remission: Secondary | ICD-10-CM | POA: Diagnosis not present

## 2017-08-04 DIAGNOSIS — F3110 Bipolar disorder, current episode manic without psychotic features, unspecified: Secondary | ICD-10-CM | POA: Diagnosis not present

## 2017-08-04 DIAGNOSIS — F319 Bipolar disorder, unspecified: Secondary | ICD-10-CM | POA: Diagnosis not present

## 2017-08-04 DIAGNOSIS — F1521 Other stimulant dependence, in remission: Secondary | ICD-10-CM | POA: Diagnosis not present

## 2017-08-07 DIAGNOSIS — F1221 Cannabis dependence, in remission: Secondary | ICD-10-CM | POA: Diagnosis not present

## 2017-08-07 DIAGNOSIS — F319 Bipolar disorder, unspecified: Secondary | ICD-10-CM | POA: Diagnosis not present

## 2017-08-07 DIAGNOSIS — F1520 Other stimulant dependence, uncomplicated: Secondary | ICD-10-CM | POA: Diagnosis not present

## 2017-08-07 DIAGNOSIS — F3110 Bipolar disorder, current episode manic without psychotic features, unspecified: Secondary | ICD-10-CM | POA: Diagnosis not present

## 2017-08-07 DIAGNOSIS — F1521 Other stimulant dependence, in remission: Secondary | ICD-10-CM | POA: Diagnosis not present

## 2017-08-07 DIAGNOSIS — F1121 Opioid dependence, in remission: Secondary | ICD-10-CM | POA: Diagnosis not present

## 2017-08-08 DIAGNOSIS — F1420 Cocaine dependence, uncomplicated: Secondary | ICD-10-CM | POA: Diagnosis not present

## 2017-08-08 DIAGNOSIS — F319 Bipolar disorder, unspecified: Secondary | ICD-10-CM | POA: Diagnosis not present

## 2017-08-08 DIAGNOSIS — F3189 Other bipolar disorder: Secondary | ICD-10-CM | POA: Diagnosis not present

## 2017-08-08 DIAGNOSIS — F1520 Other stimulant dependence, uncomplicated: Secondary | ICD-10-CM | POA: Diagnosis not present

## 2017-08-08 DIAGNOSIS — F1320 Sedative, hypnotic or anxiolytic dependence, uncomplicated: Secondary | ICD-10-CM | POA: Diagnosis not present

## 2017-08-08 DIAGNOSIS — F1521 Other stimulant dependence, in remission: Secondary | ICD-10-CM | POA: Diagnosis not present

## 2017-08-08 DIAGNOSIS — F1121 Opioid dependence, in remission: Secondary | ICD-10-CM | POA: Diagnosis not present

## 2017-08-08 DIAGNOSIS — F3110 Bipolar disorder, current episode manic without psychotic features, unspecified: Secondary | ICD-10-CM | POA: Diagnosis not present

## 2017-08-08 DIAGNOSIS — F1221 Cannabis dependence, in remission: Secondary | ICD-10-CM | POA: Diagnosis not present

## 2017-08-09 DIAGNOSIS — F1121 Opioid dependence, in remission: Secondary | ICD-10-CM | POA: Diagnosis not present

## 2017-08-09 DIAGNOSIS — F1221 Cannabis dependence, in remission: Secondary | ICD-10-CM | POA: Diagnosis not present

## 2017-08-09 DIAGNOSIS — F1520 Other stimulant dependence, uncomplicated: Secondary | ICD-10-CM | POA: Diagnosis not present

## 2017-08-09 DIAGNOSIS — F1521 Other stimulant dependence, in remission: Secondary | ICD-10-CM | POA: Diagnosis not present

## 2017-08-09 DIAGNOSIS — F3110 Bipolar disorder, current episode manic without psychotic features, unspecified: Secondary | ICD-10-CM | POA: Diagnosis not present

## 2017-08-09 DIAGNOSIS — F319 Bipolar disorder, unspecified: Secondary | ICD-10-CM | POA: Diagnosis not present

## 2017-08-10 DIAGNOSIS — F1121 Opioid dependence, in remission: Secondary | ICD-10-CM | POA: Diagnosis not present

## 2017-08-10 DIAGNOSIS — F3110 Bipolar disorder, current episode manic without psychotic features, unspecified: Secondary | ICD-10-CM | POA: Diagnosis not present

## 2017-08-10 DIAGNOSIS — F1520 Other stimulant dependence, uncomplicated: Secondary | ICD-10-CM | POA: Diagnosis not present

## 2017-08-10 DIAGNOSIS — F1521 Other stimulant dependence, in remission: Secondary | ICD-10-CM | POA: Diagnosis not present

## 2017-08-10 DIAGNOSIS — F319 Bipolar disorder, unspecified: Secondary | ICD-10-CM | POA: Diagnosis not present

## 2017-08-10 DIAGNOSIS — F1221 Cannabis dependence, in remission: Secondary | ICD-10-CM | POA: Diagnosis not present

## 2017-08-11 DIAGNOSIS — F1920 Other psychoactive substance dependence, uncomplicated: Secondary | ICD-10-CM | POA: Diagnosis not present

## 2017-08-14 DIAGNOSIS — F1221 Cannabis dependence, in remission: Secondary | ICD-10-CM | POA: Diagnosis not present

## 2017-08-14 DIAGNOSIS — F319 Bipolar disorder, unspecified: Secondary | ICD-10-CM | POA: Diagnosis not present

## 2017-08-14 DIAGNOSIS — F3110 Bipolar disorder, current episode manic without psychotic features, unspecified: Secondary | ICD-10-CM | POA: Diagnosis not present

## 2017-08-14 DIAGNOSIS — F1521 Other stimulant dependence, in remission: Secondary | ICD-10-CM | POA: Diagnosis not present

## 2017-08-14 DIAGNOSIS — F1121 Opioid dependence, in remission: Secondary | ICD-10-CM | POA: Diagnosis not present

## 2017-08-14 DIAGNOSIS — F1520 Other stimulant dependence, uncomplicated: Secondary | ICD-10-CM | POA: Diagnosis not present

## 2017-08-15 DIAGNOSIS — F1521 Other stimulant dependence, in remission: Secondary | ICD-10-CM | POA: Diagnosis not present

## 2017-08-15 DIAGNOSIS — F1221 Cannabis dependence, in remission: Secondary | ICD-10-CM | POA: Diagnosis not present

## 2017-08-15 DIAGNOSIS — F319 Bipolar disorder, unspecified: Secondary | ICD-10-CM | POA: Diagnosis not present

## 2017-08-15 DIAGNOSIS — F3110 Bipolar disorder, current episode manic without psychotic features, unspecified: Secondary | ICD-10-CM | POA: Diagnosis not present

## 2017-08-15 DIAGNOSIS — F1520 Other stimulant dependence, uncomplicated: Secondary | ICD-10-CM | POA: Diagnosis not present

## 2017-08-15 DIAGNOSIS — F1121 Opioid dependence, in remission: Secondary | ICD-10-CM | POA: Diagnosis not present

## 2017-08-16 DIAGNOSIS — F319 Bipolar disorder, unspecified: Secondary | ICD-10-CM | POA: Diagnosis not present

## 2017-08-16 DIAGNOSIS — F1121 Opioid dependence, in remission: Secondary | ICD-10-CM | POA: Diagnosis not present

## 2017-08-16 DIAGNOSIS — F1521 Other stimulant dependence, in remission: Secondary | ICD-10-CM | POA: Diagnosis not present

## 2017-08-16 DIAGNOSIS — F3110 Bipolar disorder, current episode manic without psychotic features, unspecified: Secondary | ICD-10-CM | POA: Diagnosis not present

## 2017-08-16 DIAGNOSIS — F1221 Cannabis dependence, in remission: Secondary | ICD-10-CM | POA: Diagnosis not present

## 2017-08-16 DIAGNOSIS — F1520 Other stimulant dependence, uncomplicated: Secondary | ICD-10-CM | POA: Diagnosis not present

## 2017-08-17 DIAGNOSIS — F319 Bipolar disorder, unspecified: Secondary | ICD-10-CM | POA: Diagnosis not present

## 2017-08-17 DIAGNOSIS — F3110 Bipolar disorder, current episode manic without psychotic features, unspecified: Secondary | ICD-10-CM | POA: Diagnosis not present

## 2017-08-17 DIAGNOSIS — F1520 Other stimulant dependence, uncomplicated: Secondary | ICD-10-CM | POA: Diagnosis not present

## 2017-08-17 DIAGNOSIS — F1121 Opioid dependence, in remission: Secondary | ICD-10-CM | POA: Diagnosis not present

## 2017-08-17 DIAGNOSIS — F1221 Cannabis dependence, in remission: Secondary | ICD-10-CM | POA: Diagnosis not present

## 2017-08-17 DIAGNOSIS — F1521 Other stimulant dependence, in remission: Secondary | ICD-10-CM | POA: Diagnosis not present

## 2017-08-18 DIAGNOSIS — F1920 Other psychoactive substance dependence, uncomplicated: Secondary | ICD-10-CM | POA: Diagnosis not present

## 2017-08-22 DIAGNOSIS — F319 Bipolar disorder, unspecified: Secondary | ICD-10-CM | POA: Diagnosis not present

## 2017-08-22 DIAGNOSIS — F1121 Opioid dependence, in remission: Secondary | ICD-10-CM | POA: Diagnosis not present

## 2017-08-22 DIAGNOSIS — F3110 Bipolar disorder, current episode manic without psychotic features, unspecified: Secondary | ICD-10-CM | POA: Diagnosis not present

## 2017-08-22 DIAGNOSIS — F1221 Cannabis dependence, in remission: Secondary | ICD-10-CM | POA: Diagnosis not present

## 2017-08-22 DIAGNOSIS — F1521 Other stimulant dependence, in remission: Secondary | ICD-10-CM | POA: Diagnosis not present

## 2017-08-22 DIAGNOSIS — F1520 Other stimulant dependence, uncomplicated: Secondary | ICD-10-CM | POA: Diagnosis not present

## 2017-08-23 DIAGNOSIS — F3110 Bipolar disorder, current episode manic without psychotic features, unspecified: Secondary | ICD-10-CM | POA: Diagnosis not present

## 2017-08-23 DIAGNOSIS — F319 Bipolar disorder, unspecified: Secondary | ICD-10-CM | POA: Diagnosis not present

## 2017-08-23 DIAGNOSIS — F1521 Other stimulant dependence, in remission: Secondary | ICD-10-CM | POA: Diagnosis not present

## 2017-08-23 DIAGNOSIS — F1520 Other stimulant dependence, uncomplicated: Secondary | ICD-10-CM | POA: Diagnosis not present

## 2017-08-23 DIAGNOSIS — F1221 Cannabis dependence, in remission: Secondary | ICD-10-CM | POA: Diagnosis not present

## 2017-08-23 DIAGNOSIS — F1121 Opioid dependence, in remission: Secondary | ICD-10-CM | POA: Diagnosis not present

## 2017-08-24 DIAGNOSIS — F1920 Other psychoactive substance dependence, uncomplicated: Secondary | ICD-10-CM | POA: Diagnosis not present

## 2017-08-24 DIAGNOSIS — F1121 Opioid dependence, in remission: Secondary | ICD-10-CM | POA: Diagnosis not present

## 2017-08-24 DIAGNOSIS — F1521 Other stimulant dependence, in remission: Secondary | ICD-10-CM | POA: Diagnosis not present

## 2017-08-24 DIAGNOSIS — F1520 Other stimulant dependence, uncomplicated: Secondary | ICD-10-CM | POA: Diagnosis not present

## 2017-08-24 DIAGNOSIS — F3110 Bipolar disorder, current episode manic without psychotic features, unspecified: Secondary | ICD-10-CM | POA: Diagnosis not present

## 2017-08-24 DIAGNOSIS — F1221 Cannabis dependence, in remission: Secondary | ICD-10-CM | POA: Diagnosis not present

## 2017-08-24 DIAGNOSIS — F319 Bipolar disorder, unspecified: Secondary | ICD-10-CM | POA: Diagnosis not present

## 2017-08-25 DIAGNOSIS — F319 Bipolar disorder, unspecified: Secondary | ICD-10-CM | POA: Diagnosis not present

## 2017-08-25 DIAGNOSIS — F1521 Other stimulant dependence, in remission: Secondary | ICD-10-CM | POA: Diagnosis not present

## 2017-08-25 DIAGNOSIS — F1221 Cannabis dependence, in remission: Secondary | ICD-10-CM | POA: Diagnosis not present

## 2017-08-25 DIAGNOSIS — F1121 Opioid dependence, in remission: Secondary | ICD-10-CM | POA: Diagnosis not present

## 2017-08-25 DIAGNOSIS — F3110 Bipolar disorder, current episode manic without psychotic features, unspecified: Secondary | ICD-10-CM | POA: Diagnosis not present

## 2017-08-25 DIAGNOSIS — F1520 Other stimulant dependence, uncomplicated: Secondary | ICD-10-CM | POA: Diagnosis not present

## 2017-08-29 DIAGNOSIS — F3110 Bipolar disorder, current episode manic without psychotic features, unspecified: Secondary | ICD-10-CM | POA: Diagnosis not present

## 2017-08-29 DIAGNOSIS — F1121 Opioid dependence, in remission: Secondary | ICD-10-CM | POA: Diagnosis not present

## 2017-08-29 DIAGNOSIS — F1520 Other stimulant dependence, uncomplicated: Secondary | ICD-10-CM | POA: Diagnosis not present

## 2017-08-29 DIAGNOSIS — F1221 Cannabis dependence, in remission: Secondary | ICD-10-CM | POA: Diagnosis not present

## 2017-08-30 DIAGNOSIS — F1221 Cannabis dependence, in remission: Secondary | ICD-10-CM | POA: Diagnosis not present

## 2017-08-30 DIAGNOSIS — F1520 Other stimulant dependence, uncomplicated: Secondary | ICD-10-CM | POA: Diagnosis not present

## 2017-08-30 DIAGNOSIS — F3110 Bipolar disorder, current episode manic without psychotic features, unspecified: Secondary | ICD-10-CM | POA: Diagnosis not present

## 2017-08-30 DIAGNOSIS — F1121 Opioid dependence, in remission: Secondary | ICD-10-CM | POA: Diagnosis not present

## 2017-08-31 DIAGNOSIS — F1221 Cannabis dependence, in remission: Secondary | ICD-10-CM | POA: Diagnosis not present

## 2017-08-31 DIAGNOSIS — F1121 Opioid dependence, in remission: Secondary | ICD-10-CM | POA: Diagnosis not present

## 2017-08-31 DIAGNOSIS — F3110 Bipolar disorder, current episode manic without psychotic features, unspecified: Secondary | ICD-10-CM | POA: Diagnosis not present

## 2017-08-31 DIAGNOSIS — F1520 Other stimulant dependence, uncomplicated: Secondary | ICD-10-CM | POA: Diagnosis not present

## 2017-09-01 DIAGNOSIS — F1920 Other psychoactive substance dependence, uncomplicated: Secondary | ICD-10-CM | POA: Diagnosis not present

## 2017-09-06 DIAGNOSIS — F1121 Opioid dependence, in remission: Secondary | ICD-10-CM | POA: Diagnosis not present

## 2017-09-06 DIAGNOSIS — F1520 Other stimulant dependence, uncomplicated: Secondary | ICD-10-CM | POA: Diagnosis not present

## 2017-09-06 DIAGNOSIS — F3110 Bipolar disorder, current episode manic without psychotic features, unspecified: Secondary | ICD-10-CM | POA: Diagnosis not present

## 2017-09-06 DIAGNOSIS — F1221 Cannabis dependence, in remission: Secondary | ICD-10-CM | POA: Diagnosis not present

## 2017-09-08 DIAGNOSIS — F1920 Other psychoactive substance dependence, uncomplicated: Secondary | ICD-10-CM | POA: Diagnosis not present

## 2017-09-12 DIAGNOSIS — F1520 Other stimulant dependence, uncomplicated: Secondary | ICD-10-CM | POA: Diagnosis not present

## 2017-09-12 DIAGNOSIS — F1121 Opioid dependence, in remission: Secondary | ICD-10-CM | POA: Diagnosis not present

## 2017-09-12 DIAGNOSIS — F1221 Cannabis dependence, in remission: Secondary | ICD-10-CM | POA: Diagnosis not present

## 2017-09-12 DIAGNOSIS — F3110 Bipolar disorder, current episode manic without psychotic features, unspecified: Secondary | ICD-10-CM | POA: Diagnosis not present

## 2017-09-13 DIAGNOSIS — F1520 Other stimulant dependence, uncomplicated: Secondary | ICD-10-CM | POA: Diagnosis not present

## 2017-09-13 DIAGNOSIS — F3110 Bipolar disorder, current episode manic without psychotic features, unspecified: Secondary | ICD-10-CM | POA: Diagnosis not present

## 2017-09-13 DIAGNOSIS — F1221 Cannabis dependence, in remission: Secondary | ICD-10-CM | POA: Diagnosis not present

## 2017-09-13 DIAGNOSIS — F1121 Opioid dependence, in remission: Secondary | ICD-10-CM | POA: Diagnosis not present

## 2017-09-15 DIAGNOSIS — F1920 Other psychoactive substance dependence, uncomplicated: Secondary | ICD-10-CM | POA: Diagnosis not present

## 2017-09-22 DIAGNOSIS — F1520 Other stimulant dependence, uncomplicated: Secondary | ICD-10-CM | POA: Diagnosis not present

## 2017-09-22 DIAGNOSIS — F3110 Bipolar disorder, current episode manic without psychotic features, unspecified: Secondary | ICD-10-CM | POA: Diagnosis not present

## 2017-09-22 DIAGNOSIS — F1121 Opioid dependence, in remission: Secondary | ICD-10-CM | POA: Diagnosis not present

## 2017-09-22 DIAGNOSIS — F1221 Cannabis dependence, in remission: Secondary | ICD-10-CM | POA: Diagnosis not present

## 2017-10-04 DIAGNOSIS — F1920 Other psychoactive substance dependence, uncomplicated: Secondary | ICD-10-CM | POA: Diagnosis not present

## 2017-10-06 DIAGNOSIS — F1121 Opioid dependence, in remission: Secondary | ICD-10-CM | POA: Diagnosis not present

## 2017-10-06 DIAGNOSIS — F3110 Bipolar disorder, current episode manic without psychotic features, unspecified: Secondary | ICD-10-CM | POA: Diagnosis not present

## 2017-10-06 DIAGNOSIS — F1221 Cannabis dependence, in remission: Secondary | ICD-10-CM | POA: Diagnosis not present

## 2017-10-06 DIAGNOSIS — F1520 Other stimulant dependence, uncomplicated: Secondary | ICD-10-CM | POA: Diagnosis not present

## 2017-10-10 DIAGNOSIS — F1920 Other psychoactive substance dependence, uncomplicated: Secondary | ICD-10-CM | POA: Diagnosis not present

## 2017-10-17 DIAGNOSIS — F1920 Other psychoactive substance dependence, uncomplicated: Secondary | ICD-10-CM | POA: Diagnosis not present

## 2017-10-20 DIAGNOSIS — F3110 Bipolar disorder, current episode manic without psychotic features, unspecified: Secondary | ICD-10-CM | POA: Diagnosis not present

## 2017-10-20 DIAGNOSIS — F1221 Cannabis dependence, in remission: Secondary | ICD-10-CM | POA: Diagnosis not present

## 2017-10-20 DIAGNOSIS — F1121 Opioid dependence, in remission: Secondary | ICD-10-CM | POA: Diagnosis not present

## 2017-10-20 DIAGNOSIS — F1520 Other stimulant dependence, uncomplicated: Secondary | ICD-10-CM | POA: Diagnosis not present

## 2017-10-24 DIAGNOSIS — F1920 Other psychoactive substance dependence, uncomplicated: Secondary | ICD-10-CM | POA: Diagnosis not present

## 2017-10-31 DIAGNOSIS — F1920 Other psychoactive substance dependence, uncomplicated: Secondary | ICD-10-CM | POA: Diagnosis not present

## 2017-11-02 DIAGNOSIS — F1920 Other psychoactive substance dependence, uncomplicated: Secondary | ICD-10-CM | POA: Diagnosis not present

## 2017-11-06 DIAGNOSIS — F1920 Other psychoactive substance dependence, uncomplicated: Secondary | ICD-10-CM | POA: Diagnosis not present

## 2017-11-09 DIAGNOSIS — F1920 Other psychoactive substance dependence, uncomplicated: Secondary | ICD-10-CM | POA: Diagnosis not present

## 2017-11-13 DIAGNOSIS — F1920 Other psychoactive substance dependence, uncomplicated: Secondary | ICD-10-CM | POA: Diagnosis not present

## 2017-11-16 DIAGNOSIS — F1920 Other psychoactive substance dependence, uncomplicated: Secondary | ICD-10-CM | POA: Diagnosis not present

## 2017-11-17 DIAGNOSIS — F1221 Cannabis dependence, in remission: Secondary | ICD-10-CM | POA: Diagnosis not present

## 2017-11-17 DIAGNOSIS — F3110 Bipolar disorder, current episode manic without psychotic features, unspecified: Secondary | ICD-10-CM | POA: Diagnosis not present

## 2017-11-17 DIAGNOSIS — F1121 Opioid dependence, in remission: Secondary | ICD-10-CM | POA: Diagnosis not present

## 2017-11-17 DIAGNOSIS — F1520 Other stimulant dependence, uncomplicated: Secondary | ICD-10-CM | POA: Diagnosis not present

## 2017-12-01 DIAGNOSIS — F1920 Other psychoactive substance dependence, uncomplicated: Secondary | ICD-10-CM | POA: Diagnosis not present

## 2017-12-04 DIAGNOSIS — F1920 Other psychoactive substance dependence, uncomplicated: Secondary | ICD-10-CM | POA: Diagnosis not present

## 2017-12-07 DIAGNOSIS — F1920 Other psychoactive substance dependence, uncomplicated: Secondary | ICD-10-CM | POA: Diagnosis not present

## 2017-12-14 DIAGNOSIS — F1920 Other psychoactive substance dependence, uncomplicated: Secondary | ICD-10-CM | POA: Diagnosis not present

## 2017-12-23 DIAGNOSIS — F1920 Other psychoactive substance dependence, uncomplicated: Secondary | ICD-10-CM | POA: Diagnosis not present

## 2019-04-22 ENCOUNTER — Emergency Department
Admission: EM | Admit: 2019-04-22 | Discharge: 2019-04-22 | Disposition: A | Discharge status: Home / Self Care | Payer: Self-pay | Attending: Physician Assistant | Admitting: Physician Assistant

## 2019-04-22 ENCOUNTER — Other Ambulatory Visit: Payer: Self-pay

## 2019-04-22 ENCOUNTER — Encounter (HOSPITAL_COMMUNITY): Payer: Self-pay

## 2019-04-22 ENCOUNTER — Emergency Department (EMERGENCY_DEPARTMENT_HOSPITAL): Payer: Self-pay

## 2019-04-22 DIAGNOSIS — S61312A Laceration without foreign body of right middle finger with damage to nail, initial encounter: Secondary | ICD-10-CM

## 2019-04-22 DIAGNOSIS — S62662A Nondisplaced fracture of distal phalanx of right middle finger, initial encounter for closed fracture: Secondary | ICD-10-CM

## 2019-04-22 DIAGNOSIS — S62639B Displaced fracture of distal phalanx of unspecified finger, initial encounter for open fracture: Secondary | ICD-10-CM

## 2019-04-22 DIAGNOSIS — W293XXA Contact with powered garden and outdoor hand tools and machinery, initial encounter: Secondary | ICD-10-CM

## 2019-04-22 DIAGNOSIS — W271XXA Contact with garden tool, initial encounter: Secondary | ICD-10-CM | POA: Insufficient documentation

## 2019-04-22 DIAGNOSIS — F17200 Nicotine dependence, unspecified, uncomplicated: Secondary | ICD-10-CM | POA: Insufficient documentation

## 2019-04-22 DIAGNOSIS — S61212A Laceration without foreign body of right middle finger without damage to nail, initial encounter: Secondary | ICD-10-CM | POA: Insufficient documentation

## 2019-04-22 MED ORDER — ACETAMINOPHEN 300 MG-CODEINE 30 MG TABLET
1.00 | ORAL_TABLET | Freq: Four times a day (QID) | ORAL | 0 refills | Status: AC | PRN
Start: 2019-04-22 — End: ?

## 2019-04-22 MED ORDER — CEPHALEXIN 500 MG CAPSULE
500.0000 mg | ORAL_CAPSULE | ORAL | Status: AC
Start: 2019-04-22 — End: 2019-04-22
  Administered 2019-04-22: 14:00:00 500 mg via ORAL
  Filled 2019-04-22: qty 1

## 2019-04-22 MED ORDER — DIPHTH,PERTUSSIS(ACEL),TETANUS 2.5 LF UNIT-8 MCG-5 LF/0.5ML IM SYRINGE
0.50 mL | INJECTION | INTRAMUSCULAR | Status: DC
Start: 2019-04-22 — End: 2019-04-22
  Administered 2019-04-22: 14:00:00 0 mL via INTRAMUSCULAR
  Filled 2019-04-22: qty 0.5

## 2019-04-22 MED ORDER — CEPHALEXIN 500 MG CAPSULE
500.00 mg | ORAL_CAPSULE | Freq: Three times a day (TID) | ORAL | 0 refills | Status: AC
Start: 2019-04-22 — End: 2019-05-02

## 2019-04-22 MED ORDER — IBUPROFEN 800 MG TABLET
800.00 mg | ORAL_TABLET | ORAL | Status: AC
Start: 2019-04-22 — End: 2019-04-22
  Administered 2019-04-22: 14:00:00 800 mg via ORAL
  Filled 2019-04-22: qty 1

## 2019-04-22 NOTE — ED Triage Notes (Addendum)
Patient arrived to ER with c/o cutting right hand 3rd digit with pruning shears.  Pt has bleeding controled at time of triage.  Pt A/O x 4, no apparent distress at time of triage.

## 2019-04-22 NOTE — ED Nurses Note (Signed)
Patient discharged home with family.  AVS reviewed with patient/care giver.  A written copy of the AVS and discharge instructions was given to the patient/care giver.  Questions sufficiently answered as needed.  Patient/care giver encouraged to follow up with PCP as indicated.  In the event of an emergency, patient/care giver instructed to call 911 or go to the nearest emergency room.     Pt given discharge instruction and denies any questions.  PT is to follow up with PCP. PT has 2 Rx to get filled.

## 2019-04-22 NOTE — ED Nurses Note (Signed)
Provider at the bedside for sutures.

## 2019-04-22 NOTE — ED Provider Notes (Signed)
[facility]  Emergency Department     HISTORY OF PRESENT ILLNESS     Date:  04/22/2019  Patient's Name:  Luis Roy  Date of Birth:  1990-11-10    HPI patient here complaining of right middle finger injury.  Patient states he cut with hedge trimmers unknown last tetanus shot pain with range of motion no numbness or tingling no other complaints or injuries came in for eval.     Review of Systems     Review of Systems   Constitutional: Negative.    HENT: Negative.    Eyes: Negative.    Respiratory: Negative.    Cardiovascular: Negative.    Gastrointestinal: Negative.    Genitourinary: Negative.    Musculoskeletal: Negative.    Skin: Positive for wound.   Neurological: Negative.    Hematological: Negative.    Psychiatric/Behavioral: Negative.        Previous History     Past Medical History:  History reviewed. No pertinent past medical history.    Past Surgical History:  History reviewed. No pertinent surgical history.    Social History:  Social History     Tobacco Use   . Smoking status: Current Some Day Smoker   . Smokeless tobacco: Never Used   Substance Use Topics   . Alcohol use: Not Currently   . Drug use: Never     Social History     Substance and Sexual Activity   Drug Use Never       Family History:  No family history on file.    Medication History:  Current Outpatient Medications   Medication Sig   . acetaminophen-codeine (TYLENOL #3) 300-30 mg Oral Tablet Take 1 Tab by mouth Every 6 hours as needed   . cephalexin (KEFLEX) 500 mg Oral Capsule Take 1 Cap (500 mg total) by mouth Three times a day for 10 days       Allergies:  No Known Allergies    Physical Exam     Vitals:    BP 127/88   Pulse 70   Temp 37.1 C (98.7 F)   Resp 18   Ht 1.829 m (6')   Wt 90.7 kg (200 lb)   SpO2 100%   BMI 27.12 kg/m         Physical Exam   Constitutional: He is oriented to person, place, and time. He appears well-developed and well-nourished.   HENT:   Head: Normocephalic and  atraumatic.   Mouth/Throat: Oropharynx is clear and moist.   Eyes: Pupils are equal, round, and reactive to light. Conjunctivae and EOM are normal.   Cardiovascular: Normal rate and regular rhythm.   Pulmonary/Chest: Effort normal and breath sounds normal.   Musculoskeletal: Normal range of motion.         General: Tenderness present.      Comments: Tender palpation of the distal tip of the right middle finger noted a 2 cm laceration on a vertical axis full range of motion though with pain neurovascularly intact   Neurological: He is alert and oriented to person, place, and time.   Skin: Skin is warm and dry.   Psychiatric: He has a normal mood and affect. His behavior is normal.   Nursing note and vitals reviewed.      Physical Exam  Vitals signs and nursing note reviewed.   Constitutional:       Appearance: He is well-developed and well-nourished.   HENT:  Head: Normocephalic and atraumatic.      Mouth/Throat:      Mouth: Oropharynx is clear and moist.   Eyes:      Extraocular Movements: EOM normal.      Conjunctiva/sclera: Conjunctivae normal.      Pupils: Pupils are equal, round, and reactive to light.   Cardiovascular:      Rate and Rhythm: Normal rate and regular rhythm.   Pulmonary:      Effort: Pulmonary effort is normal.      Breath sounds: Normal breath sounds.   Musculoskeletal: Normal range of motion.         General: Tenderness present.      Comments: Tender palpation of the distal tip of the right middle finger noted a 2 cm laceration on a vertical axis full range of motion though with pain neurovascularly intact   Skin:     General: Skin is warm and dry.   Neurological:      Mental Status: He is alert and oriented to person, place, and time.   Psychiatric:         Mood and Affect: Mood and affect normal.         Behavior: Behavior normal.         Diagnostic Studies/Treatment     Medications:  Medications   diphtheria, pertussis-acell, tetanus (BOOSTRIX) IM injection (0 mL IntraMUSCULAR Not Given  04/22/19 1400)   ibuprofen (MOTRIN) tablet (800 mg Oral Given 04/22/19 1330)   cephalexin (KEFLEX) capsule (500 mg Oral Given 04/22/19 1357)       New Prescriptions    ACETAMINOPHEN-CODEINE (TYLENOL #3) 300-30 MG ORAL TABLET    Take 1 Tab by mouth Every 6 hours as needed    CEPHALEXIN (KEFLEX) 500 MG ORAL CAPSULE    Take 1 Cap (500 mg total) by mouth Three times a day for 10 days       Labs:    No results found for any visits on 04/22/19.    Radiology:  XR FINGER, 3RD/MIDDLE RIGHT    XR FINGER, 3RD/MIDDLE RIGHT   Final Result   Nondisplaced right middle finger distal phalanx tuft fracture.            Radiologist location ID: [id number]             ECG:  NONE      Procedure     LAC REPAIR/WOUND CLOSURE  Date/Time: 04/22/2019 1:57 PM  Performed by: Frazier Butt, PA-C  Authorized by: Frazier Butt, PA-C     Consent:     Consent obtained:  Verbal    Consent given by:  Patient    Risks discussed:  Infection, need for additional repair, nerve damage, poor wound healing, poor cosmetic result, pain, retained foreign body, tendon damage and vascular damage    Alternatives discussed:  No treatment  Anesthesia (see MAR for exact dosages):     Anesthesia method:  Local infiltration    Local anesthetic:  Lidocaine 1% w/o epi  Laceration details:     Location:  Finger    Finger location:  R long finger    Length (cm):  2.5    Depth (mm):  3  Repair type:     Repair type:  Simple  Pre-procedure details:     Preparation:  Patient was prepped and draped in usual sterile fashion  Exploration:     Hemostasis achieved with:  Direct pressure    Wound exploration: wound explored through full range of motion and  entire depth of wound probed and visualized      Wound extent: areolar tissue violated and fascia violated      Wound extent: no foreign bodies/material noted, no muscle damage noted, no nerve damage noted, no tendon damage noted, no underlying fracture noted and no vascular damage noted      Contaminated: no    Treatment:     Area  cleansed with:  Saline and Shur-Clens    Amount of cleaning:  Standard    Irrigation solution:  Sterile water    Irrigation method:  Tap    Visualized foreign bodies/material removed: no    Skin repair:     Repair method:  Sutures    Suture size:  5-0    Suture material:  Nylon    Suture technique:  Simple interrupted    Number of sutures:  2  Approximation:     Approximation:  Close  Post-procedure details:     Dressing:  Antibiotic ointment, non-adherent dressing, sterile dressing and splint for protection    Patient tolerance of procedure:  Tolerated well, no immediate complications        Course/Disposition/Plan     Course:        Disposition:    Discharged    Condition at Disposition:   Stable     Follow up:   Jeani HawkingSprouse, Ryan Anderson, MD  [address]  [address]  [place] [id number]  [phone]    Call in 1 day        Clinical Impression:     Encounter Diagnoses   Name Primary?   . Laceration of right middle finger without foreign body with damage to nail, initial encounter Yes   . Open fracture of tuft of distal phalanx of finger        Future Appointments Scheduled in Epic:  No future appointments.

## 2019-04-22 NOTE — ED Nurses Note (Signed)
Pt finger dressed in bulky dressing with splint. PT verbalizes understanding on keeping clean.

## 2019-04-22 NOTE — ED Nurses Note (Signed)
Provider at the bedside for evaluation.

## 2019-04-22 NOTE — Discharge Instructions (Signed)
Keep clean and dry   Ice and elevate   Return here if worsens   Follow up with your physician   No tylenol alcohol or driving with in 6 hours of medications  Return here if signs of infection

## 2019-07-16 IMAGING — CT CT HEAD W/O CM
4 series · 17 of 47 positions shown, 19 images · non-contrast
Comparison: None.

CLINICAL DATA: Posttraumatic headache

EXAM:
CT HEAD WITHOUT CONTRAST
TECHNIQUE: Contiguous axial images were obtained from the base of the skull
through the vertex without intravenous contrast.

[Series 2: head wo · axial · 0.45mm/px · z∈[-80,+25]mm · 7 of 29 slices shown, 9 images]
[im 4/29  brain]
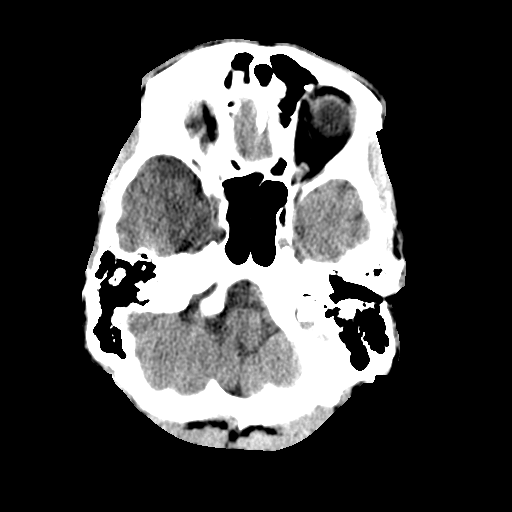
[im 4/29  bone]
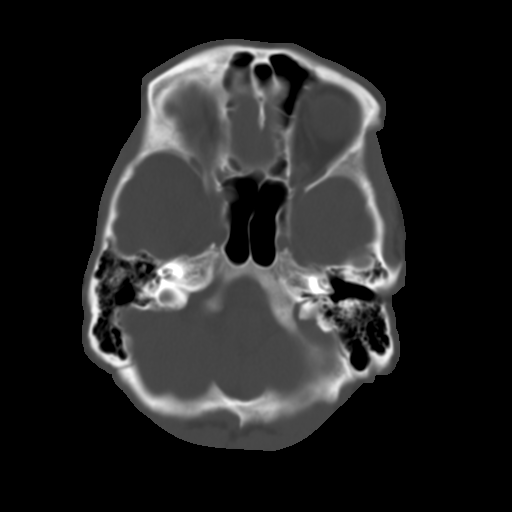
[im 8/29  brain]
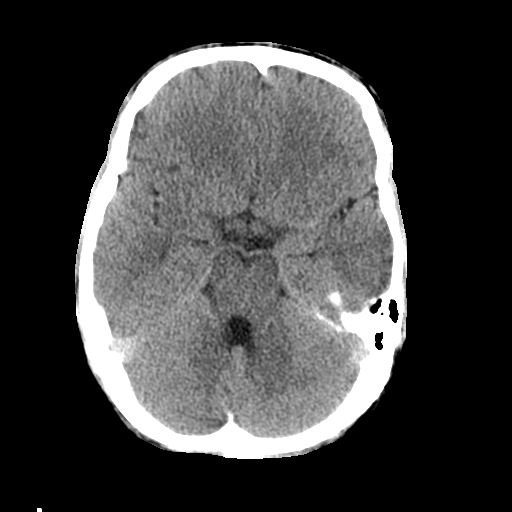
[im 11/29  brain]
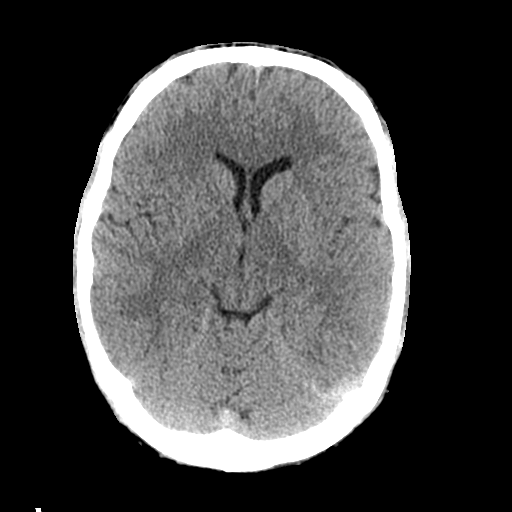
[im 15/29  brain]
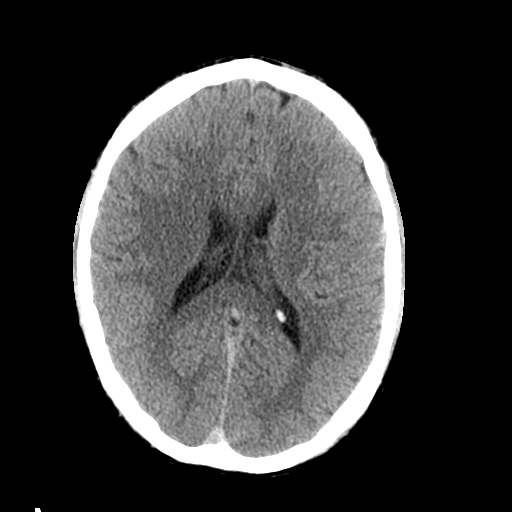
[im 18/29  brain]
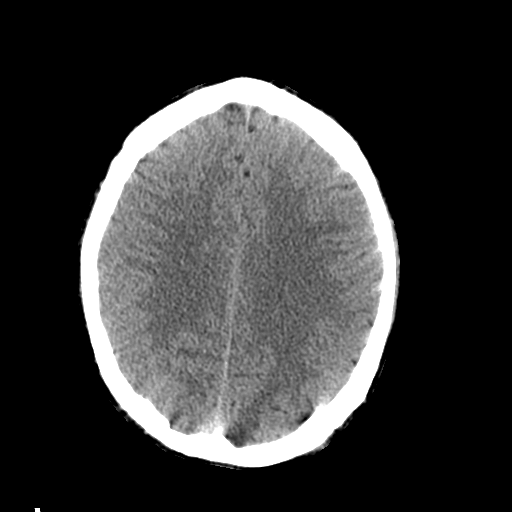
[im 18/29  bone]
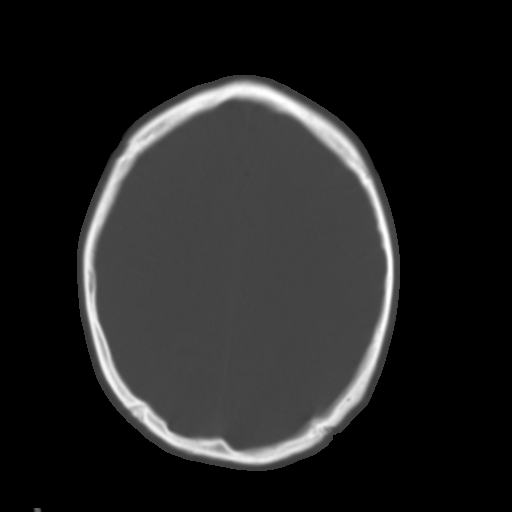
[im 22/29  brain]
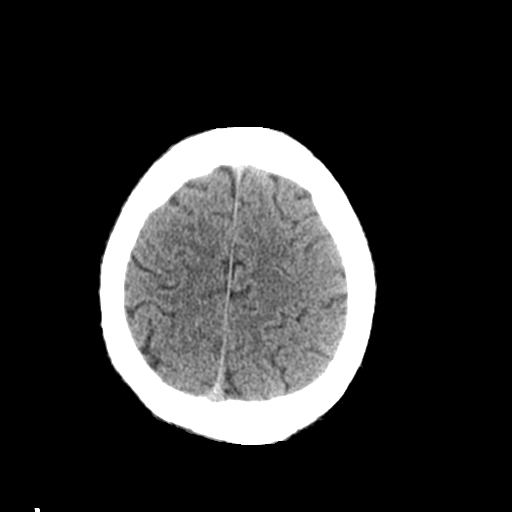
[im 25/29  brain]
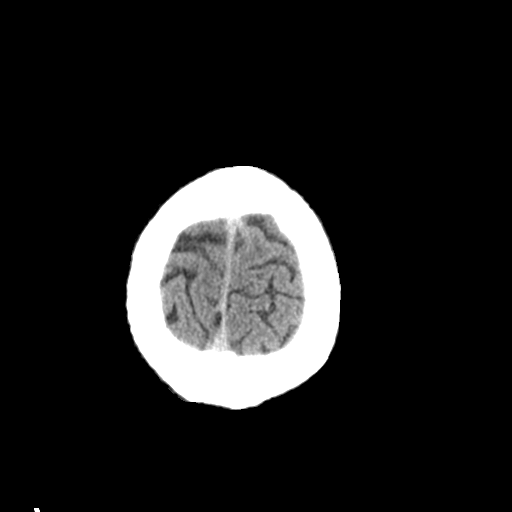

[Series 3: head bone · axial · 0.45mm/px · z∈[-81,-33]mm · 4 of 72 slices shown]
[im 8/72  bone]
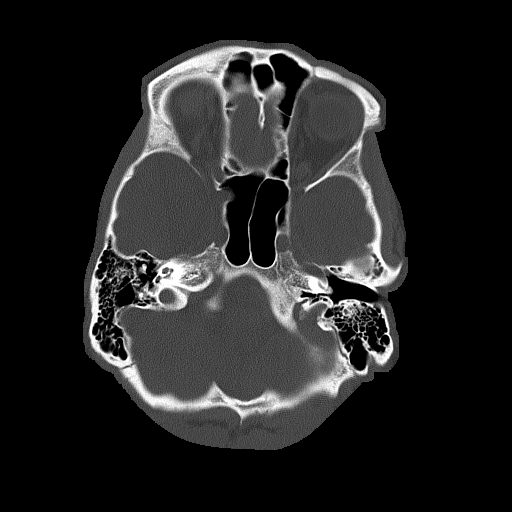
[im 15/72  bone]
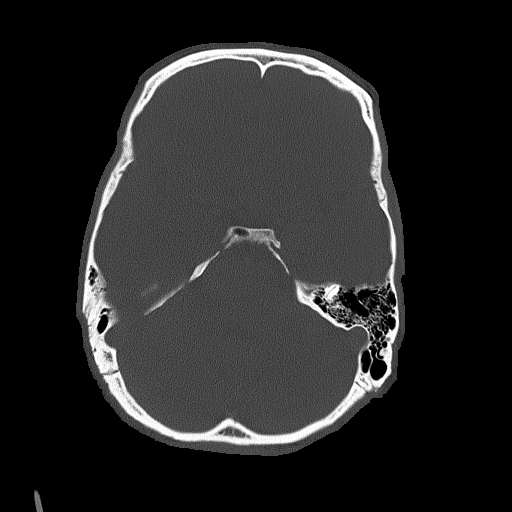
[im 22/72  bone]
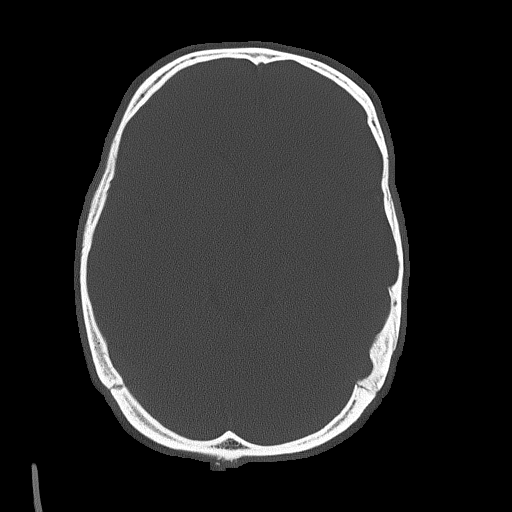
[im 32/72  bone]
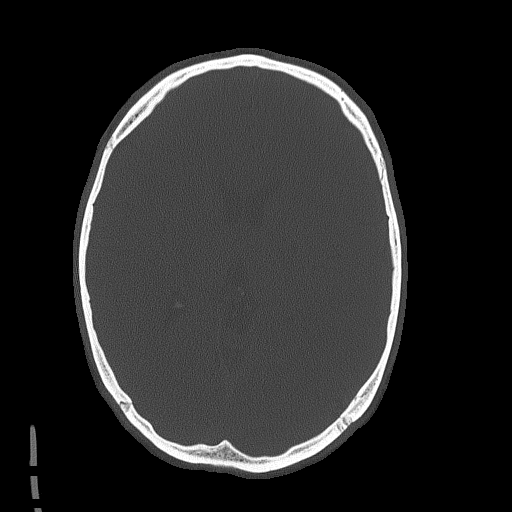

[Series 4: coronal soft tissue · coronal · 0.30mm/px · 3 of 62 slices shown]
[im 21/62  brain]
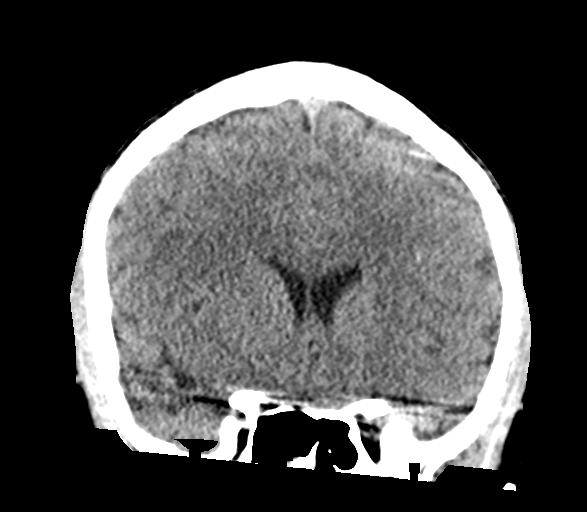
[im 28/62  brain]
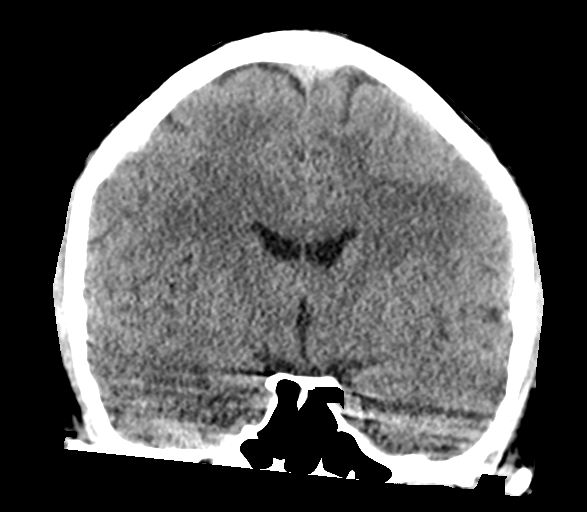
[im 34/62  brain]
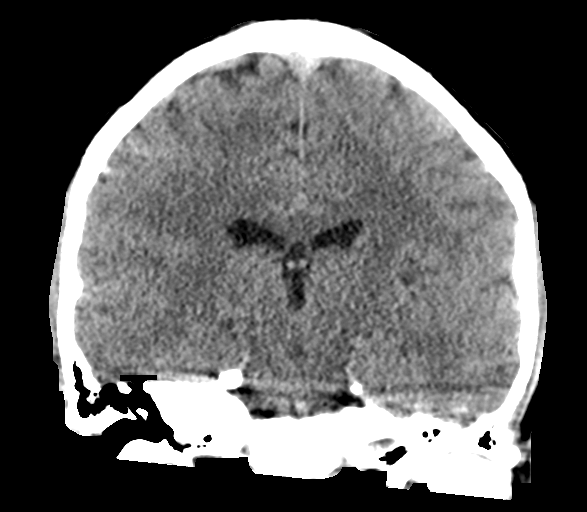

[Series 5: sagittal soft tissue · sagittal · 0.30mm/px · 3 of 52 slices shown]
[im 18/52  brain]
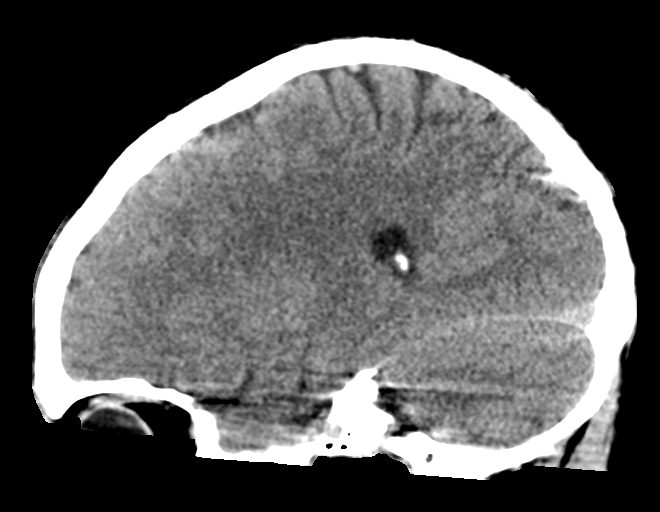
[im 26/52  brain]
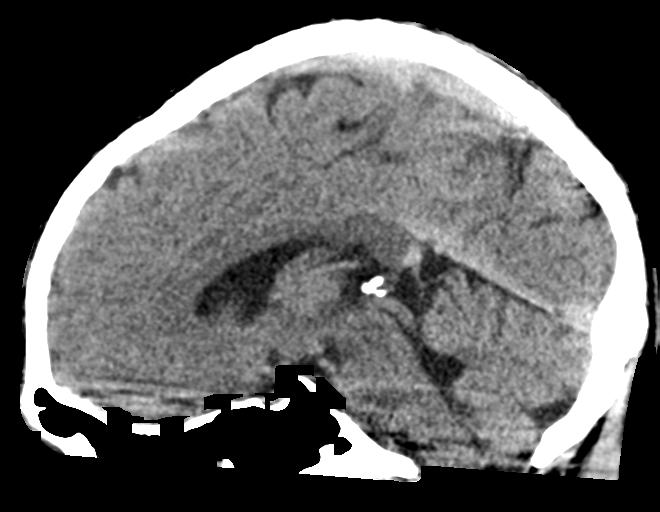
[im 35/52  brain]
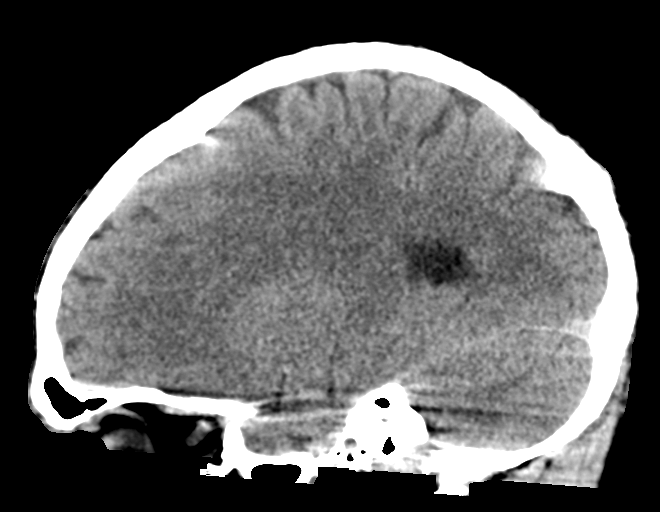

[17 of 47 positions shown; findings below may reference images not displayed]

FINDINGS: Brain: No evidence of acute infarction, hemorrhage, hydrocephalus,
extra-axial collection or mass lesion/mass effect. Image quality
degraded by motion.

Vascular: Negative for hyperdense vessel

Skull: Negative

Sinuses/Orbits: Negative

Other: None
IMPRESSION: Allowing for motion, normal CT head
